# Patient Record
Sex: Female | Born: 1960 | Race: Black or African American | Hispanic: No | Smoking: Never smoker
Health system: Southern US, Community
[De-identification: ages and names within clinical notes are randomized; demographics above are authoritative.]

## PROBLEM LIST (undated history)

## (undated) DIAGNOSIS — M1990 Unspecified osteoarthritis, unspecified site: Secondary | ICD-10-CM

## (undated) DIAGNOSIS — D561 Beta thalassemia: Secondary | ICD-10-CM

## (undated) DIAGNOSIS — G709 Myoneural disorder, unspecified: Secondary | ICD-10-CM

## (undated) DIAGNOSIS — I10 Essential (primary) hypertension: Secondary | ICD-10-CM

## (undated) DIAGNOSIS — G4730 Sleep apnea, unspecified: Secondary | ICD-10-CM

## (undated) DIAGNOSIS — J45909 Unspecified asthma, uncomplicated: Secondary | ICD-10-CM

## (undated) DIAGNOSIS — Z5189 Encounter for other specified aftercare: Secondary | ICD-10-CM

## (undated) DIAGNOSIS — I219 Acute myocardial infarction, unspecified: Secondary | ICD-10-CM

## (undated) DIAGNOSIS — D689 Coagulation defect, unspecified: Secondary | ICD-10-CM

## (undated) DIAGNOSIS — T7840XA Allergy, unspecified, initial encounter: Secondary | ICD-10-CM

## (undated) DIAGNOSIS — I2510 Atherosclerotic heart disease of native coronary artery without angina pectoris: Secondary | ICD-10-CM

## (undated) DIAGNOSIS — I499 Cardiac arrhythmia, unspecified: Secondary | ICD-10-CM

## (undated) DIAGNOSIS — R0609 Other forms of dyspnea: Secondary | ICD-10-CM

## (undated) DIAGNOSIS — K559 Vascular disorder of intestine, unspecified: Secondary | ICD-10-CM

## (undated) DIAGNOSIS — K219 Gastro-esophageal reflux disease without esophagitis: Secondary | ICD-10-CM

## (undated) DIAGNOSIS — K589 Irritable bowel syndrome without diarrhea: Secondary | ICD-10-CM

## (undated) DIAGNOSIS — E739 Lactose intolerance, unspecified: Secondary | ICD-10-CM

## (undated) DIAGNOSIS — K469 Unspecified abdominal hernia without obstruction or gangrene: Secondary | ICD-10-CM

## (undated) DIAGNOSIS — J449 Chronic obstructive pulmonary disease, unspecified: Secondary | ICD-10-CM

## (undated) HISTORY — DX: Lactose intolerance, unspecified: E73.9

## (undated) HISTORY — DX: Unspecified osteoarthritis, unspecified site: M19.90

## (undated) HISTORY — DX: Beta thalassemia: D56.1

## (undated) HISTORY — DX: Sleep apnea, unspecified: G47.30

## (undated) HISTORY — DX: Other forms of dyspnea: R06.09

## (undated) HISTORY — DX: Unspecified abdominal hernia without obstruction or gangrene: K46.9

## (undated) HISTORY — DX: Unspecified asthma, uncomplicated: J45.909

## (undated) HISTORY — DX: Coagulation defect, unspecified: D68.9

## (undated) HISTORY — PX: ROTATOR CUFF REPAIR: SHX139

## (undated) HISTORY — DX: Cardiac arrhythmia, unspecified: I49.9

## (undated) HISTORY — DX: Allergy, unspecified, initial encounter: T78.40XA

## (undated) HISTORY — PX: UPPER GASTROINTESTINAL ENDOSCOPY: SHX188

## (undated) HISTORY — PX: TUBAL LIGATION: SHX77

## (undated) HISTORY — PX: TONSILLECTOMY: SUR1361

## (undated) HISTORY — PX: SHOULDER SURGERY: SHX246

## (undated) HISTORY — DX: Encounter for other specified aftercare: Z51.89

## (undated) HISTORY — DX: Irritable bowel syndrome, unspecified: K58.9

## (undated) HISTORY — PX: KNEE SURGERY: SHX244

## (undated) HISTORY — PX: ABDOMINAL HYSTERECTOMY: SHX81

## (undated) HISTORY — DX: Atherosclerotic heart disease of native coronary artery without angina pectoris: I25.10

## (undated) HISTORY — DX: Vascular disorder of intestine, unspecified: K55.9

## (undated) HISTORY — DX: Chronic obstructive pulmonary disease, unspecified: J44.9

## (undated) HISTORY — DX: Acute myocardial infarction, unspecified: I21.9

## (undated) HISTORY — PX: COLONOSCOPY: SHX174

## (undated) HISTORY — PX: APPENDECTOMY: SHX54

## (undated) HISTORY — DX: Myoneural disorder, unspecified: G70.9

## (undated) HISTORY — DX: Gastro-esophageal reflux disease without esophagitis: K21.9

## (undated) HISTORY — DX: Essential (primary) hypertension: I10

---

## 2012-02-02 DIAGNOSIS — K559 Vascular disorder of intestine, unspecified: Secondary | ICD-10-CM | POA: Insufficient documentation

## 2012-02-02 DIAGNOSIS — K219 Gastro-esophageal reflux disease without esophagitis: Secondary | ICD-10-CM | POA: Insufficient documentation

## 2012-02-02 DIAGNOSIS — K649 Unspecified hemorrhoids: Secondary | ICD-10-CM | POA: Insufficient documentation

## 2012-02-02 DIAGNOSIS — K5900 Constipation, unspecified: Secondary | ICD-10-CM | POA: Insufficient documentation

## 2012-07-21 DIAGNOSIS — G540 Brachial plexus disorders: Secondary | ICD-10-CM | POA: Insufficient documentation

## 2012-07-21 DIAGNOSIS — D561 Beta thalassemia: Secondary | ICD-10-CM | POA: Insufficient documentation

## 2013-04-09 DIAGNOSIS — M25512 Pain in left shoulder: Secondary | ICD-10-CM | POA: Insufficient documentation

## 2015-01-04 DIAGNOSIS — I10 Essential (primary) hypertension: Secondary | ICD-10-CM | POA: Insufficient documentation

## 2015-01-04 DIAGNOSIS — D509 Iron deficiency anemia, unspecified: Secondary | ICD-10-CM | POA: Insufficient documentation

## 2015-01-04 DIAGNOSIS — N390 Urinary tract infection, site not specified: Secondary | ICD-10-CM | POA: Insufficient documentation

## 2015-01-04 DIAGNOSIS — K625 Hemorrhage of anus and rectum: Secondary | ICD-10-CM | POA: Insufficient documentation

## 2015-01-04 DIAGNOSIS — R1030 Lower abdominal pain, unspecified: Secondary | ICD-10-CM | POA: Insufficient documentation

## 2018-10-31 DIAGNOSIS — Z90710 Acquired absence of both cervix and uterus: Secondary | ICD-10-CM | POA: Insufficient documentation

## 2019-02-05 ENCOUNTER — Encounter: Payer: Self-pay | Admitting: Family Medicine

## 2019-02-05 ENCOUNTER — Ambulatory Visit (INDEPENDENT_AMBULATORY_CARE_PROVIDER_SITE_OTHER): Payer: Federal, State, Local not specified - PPO | Admitting: Family Medicine

## 2019-02-05 DIAGNOSIS — K529 Noninfective gastroenteritis and colitis, unspecified: Secondary | ICD-10-CM | POA: Diagnosis not present

## 2019-02-05 DIAGNOSIS — R0982 Postnasal drip: Secondary | ICD-10-CM | POA: Diagnosis not present

## 2019-02-05 NOTE — Progress Notes (Signed)
Established Patient Office Visit  Subjective:  Patient ID: Tiffany Martin, female    DOB: 1961/01/13  Age: 58 y.o. MRN: [id number]  CC:  Chief Complaint  Patient presents with  . Establish Care  . Sore Throat    no fever, ongoing for a few days. has started using alka seltzer, runny nose.     HPI Tiffany Martin presents for 1 week ho sore throat, runny nose, pnd. Denies fever. Little cough with out shortness of breath. No change in taste or smell. No fever. Moved down here from [place] about a week ago as well. Exposure to Covid back in [place].  She has a history of allergic rhinitis and she has been taking her fexofenadine.  She had been followed by pain management in [place] and they gave her the phone number of his sister clinic here in the [place] area.  Ho Colitis and Celiac per gastroenterologist.  No meds. On gluten free diet.    History reviewed. No pertinent past medical history.  History reviewed. No pertinent surgical history.  History reviewed. No pertinent family history.  Social History   Socioeconomic History  . Marital status: Single    Spouse name: Not on file  . Number of children: Not on file  . Years of education: Not on file  . Highest education level: Not on file  Occupational History  . Not on file  Social Needs  . Financial resource strain: Not on file  . Food insecurity    Worry: Not on file    Inability: Not on file  . Transportation needs    Medical: Not on file    Non-medical: Not on file  Tobacco Use  . Smoking status: Never Smoker  . Smokeless tobacco: Never Used  Substance and Sexual Activity  . Alcohol use: Not on file  . Drug use: Not on file  . Sexual activity: Not on file  Lifestyle  . Physical activity    Days per week: Not on file    Minutes per session: Not on file  . Stress: Not on file  Relationships  . Social Herbalist on phone: Not on file    Gets together: Not on file    Attends religious service:  Not on file    Active member of club or organization: Not on file    Attends meetings of clubs or organizations: Not on file    Relationship status: Not on file  . Intimate partner violence    Fear of current or ex partner: Not on file    Emotionally abused: Not on file    Physically abused: Not on file    Forced sexual activity: Not on file  Other Topics Concern  . Not on file  Social History Narrative  . Not on file    Outpatient Medications Prior to Visit  Medication Sig Dispense Refill  . amlodipine-olmesartan (AZOR) 10-20 MG tablet Take 1 tablet by mouth daily.    . carvedilol (COREG) 25 MG tablet Take 25 mg by mouth 2 (two) times daily.    . cyclobenzaprine (FLEXERIL) 10 MG tablet Take 10 mg by mouth 3 (three) times daily.     No facility-administered medications prior to visit.     No Known Allergies  ROS Review of Systems  Constitutional: Negative for chills, diaphoresis, fatigue, fever and unexpected weight change.  HENT: Positive for congestion, postnasal drip, sneezing and sore throat.   Eyes: Negative for photophobia and visual disturbance.  Respiratory: Negative for cough, shortness of breath and wheezing.   Cardiovascular: Negative.   Gastrointestinal: Negative.  Negative for diarrhea, nausea and vomiting.  Genitourinary: Negative.   Musculoskeletal: Negative for gait problem.  Skin: Negative for pallor and rash.  Allergic/Immunologic: Negative for immunocompromised state.  Neurological: Negative for numbness and headaches.  Hematological: Does not bruise/bleed easily.  Psychiatric/Behavioral: Negative.       Objective:    Physical Exam  Constitutional: She is oriented to person, place, and time. She appears well-developed and well-nourished. No distress.  HENT:  Head: Normocephalic and atraumatic.  Right Ear: External ear normal.  Left Ear: External ear normal.  Eyes: Right eye exhibits no discharge. Left eye exhibits no discharge. No scleral  icterus.  Neck: No JVD present.  Pulmonary/Chest: Effort normal. No stridor.  Neurological: She is alert and oriented to person, place, and time.  Skin: Skin is warm and dry. She is not diaphoretic.  Psychiatric: She has a normal mood and affect.    There were no vitals taken for this visit. Wt Readings from Last 3 Encounters:  No data found for Wt   BP Readings from Last 3 Encounters:  No data found for BP   Guideline developer:  UpToDate (see UpToDate for funding source) Date Released: June 2014  Health Maintenance Due  Topic Date Due  . Hepatitis C Screening  07/02/58  . HIV Screening  02/18/1974  . TETANUS/TDAP  02/18/1978  . PAP SMEAR-Modifier  02/19/1980  . MAMMOGRAM  02/18/2009  . COLONOSCOPY  02/18/2009  . INFLUENZA VACCINE  11/04/2018    There are no preventive care reminders to display for this patient.  No results found for: TSH No results found for: WBC, HGB, HCT, MCV, PLT No results found for: NA, K, CHLORIDE, CO2, GLUCOSE, BUN, CREATININE, BILITOT, ALKPHOS, AST, ALT, PROT, ALBUMIN, CALCIUM, ANIONGAP, EGFR, GFR No results found for: CHOL No results found for: HDL No results found for: LDLCALC No results found for: TRIG No results found for: CHOLHDL No results found for: HGBA1C    Assessment & Plan:   Problem List Items Addressed This Visit      Digestive   Colitis   Relevant Orders   Ambulatory referral to Gastroenterology     Other   Post-nasal drip - Primary      No orders of the defined types were placed in this encounter.   Follow-up: Return in about 2 weeks (around 02/19/2019).   Patient will go to the [facility] for Covid testing.  She will follow-up in a couple weeks to establish properly here at the clinic.  Her symptoms are more consistent with her usual allergy rhinitis and she agrees.  Virtual Visit via Video Note  I connected with Tiffany Martin on 02/05/19 at  3:00 PM EST by a video enabled telemedicine application  and verified that I am speaking with the correct person using two identifiers.  Location: Patient: home Provider:    I discussed the limitations of evaluation and management by telemedicine and the availability of in person appointments. The patient expressed understanding and agreed to proceed.  History of Present Illness:    Observations/Objective:   Assessment and Plan:   Follow Up Instructions:    I discussed the assessment and treatment plan with the patient. The patient was provided an opportunity to ask questions and all were answered. The patient agreed with the plan and demonstrated an understanding of the instructions.   The patient was advised to call  back or seek an in-person evaluation if the symptoms worsen or if the condition fails to improve as anticipated.  I provided 20 minutes of non-face-to-face time during this encounter.   Libby Maw, MD

## 2019-02-06 ENCOUNTER — Other Ambulatory Visit: Payer: Self-pay

## 2019-02-06 ENCOUNTER — Encounter: Payer: Self-pay | Admitting: Physician Assistant

## 2019-02-06 DIAGNOSIS — Z20822 Contact with and (suspected) exposure to covid-19: Secondary | ICD-10-CM

## 2019-02-07 LAB — NOVEL CORONAVIRUS, NAA: SARS-CoV-2, NAA: NOT DETECTED

## 2019-02-16 ENCOUNTER — Ambulatory Visit: Payer: Federal, State, Local not specified - PPO | Admitting: Physician Assistant

## 2019-02-26 ENCOUNTER — Other Ambulatory Visit: Payer: Self-pay

## 2019-02-26 ENCOUNTER — Encounter: Payer: Self-pay | Admitting: Physician Assistant

## 2019-02-26 ENCOUNTER — Ambulatory Visit: Payer: Federal, State, Local not specified - PPO | Admitting: Physician Assistant

## 2019-02-26 ENCOUNTER — Telehealth: Payer: Self-pay

## 2019-02-26 VITALS — BP 114/64 | HR 74 | Temp 97.0°F | Ht 66.5 in | Wt 225.2 lb

## 2019-02-26 DIAGNOSIS — E739 Lactose intolerance, unspecified: Secondary | ICD-10-CM | POA: Diagnosis not present

## 2019-02-26 DIAGNOSIS — K900 Celiac disease: Secondary | ICD-10-CM

## 2019-02-26 DIAGNOSIS — K219 Gastro-esophageal reflux disease without esophagitis: Secondary | ICD-10-CM

## 2019-02-26 DIAGNOSIS — M810 Age-related osteoporosis without current pathological fracture: Secondary | ICD-10-CM

## 2019-02-26 DIAGNOSIS — Z8719 Personal history of other diseases of the digestive system: Secondary | ICD-10-CM

## 2019-02-26 DIAGNOSIS — K5900 Constipation, unspecified: Secondary | ICD-10-CM | POA: Diagnosis not present

## 2019-02-26 NOTE — Progress Notes (Signed)
Chief Complaint: Establishing care for history of reflux, celiac disease, ischemic colitis, constipation and lactose intolerance  HPI:    Tiffany Martin is a 58 year old African-American female with a past medical history as listed below, who was referred to me by Lynann Bologna establish care for history of reflux, celiac disease, constipation, ischemic colitis and lactose intolerance.      (At time of patient's visit we do not have any of her old records).  \    Today, Tiffany Martin explains that she recently moved from [place] about a month ago to come and help take care of her mother who is in her 1s and her brother who has recently been diagnosed with diabetes and some other chronic medical conditions.  Explains that she overall is doing well, but "it took some time to get here".  Explains that back in 2010 she was diagnosed with ischemic colitis after episodes of abdominal pain and rectal bleeding.  Explains that she had a few colonoscopies and eventually her diagnosis was ischemic colitis.  This was thought due to her Celebrex which she was taking and she was taken off of this.  She had a few more flares throughout that year, but has had no real trouble since the end of 2010.  Does describe one other colonoscopy she thinks about 5 years ago or so for screening, but tells me she does not have polyps.    Also describes a history of celiac disease diagnosed 1/2 to 2 years ago.  She went gluten-free about a year ago and since then has had no further "hard abdominal cramps" and diarrhea.  Explains that she saw nutritionist initially and has been trying very hard to avoid gluten.  Also tells me she is lactose intolerant, so has to avoid dairy.  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    Also describes a diagnosis of reflux for which she is on over-the-counter Omeprazole 20 mg 30 minutes before breakfast.  She has no breakthrough symptoms on this medicine.    Tells me that due to her diagnosis of  ischemic colitis patient was told she has to maintain regular bowel movements and for this is taking Linzess 290 mcg daily, this works well for her.    Denies fever, chills, weight loss, nausea, vomiting, change in bowel habits or abdominal pain.  Past Medical History:  Diagnosis Date  . Abdominal hernia   . GERD (gastroesophageal reflux disease)   . High blood pressure   . Irritable bowel syndrome   . Ischemic colitis ([place])   . Lactose intolerance     Past Surgical History:  Procedure Laterality Date  . APPENDECTOMY    . CESAREAN SECTION    . COLONOSCOPY    . KNEE SURGERY    . KNEE SURGERY    . ROTATOR CUFF REPAIR    . SHOULDER SURGERY    . TONSILLECTOMY    . UPPER GASTROINTESTINAL ENDOSCOPY      Current Outpatient Medications  Medication Sig Dispense Refill  . acetaminophen (TYLENOL) 500 MG tablet Take 1,000 mg by mouth every 8 (eight) hours as needed.    [name] amlodipine-olmesartan (AZOR) 10-20 MG tablet Take 1 tablet by mouth daily.    [name] aspirin 81 MG chewable tablet Chew 1 tablet by mouth daily.    . B Complex-Biotin-FA (B-100 COMPLEX PO) Take 1 capsule by mouth daily.    . carvedilol (COREG) 25 MG tablet Take 25 mg by mouth 2 (two) times daily.    [name]  Cholecalciferol (VITAMIN D3) 25 MCG (1000 UT) CAPS Take 1 capsule by mouth daily.    . cyclobenzaprine (FLEXERIL) 10 MG tablet Take 10 mg by mouth 3 (three) times daily.    [name] DIGESTIVE ENZYMES PO Take 1 capsule by mouth 2 (two) times daily with a meal.    . Doxylamine Succinate, Sleep, (SLEEP AID PO) Take 1 tablet by mouth at bedtime.    . DULoxetine (CYMBALTA) 60 MG capsule Take 60 mg by mouth daily.    . ferrous sulfate 325 (65 FE) MG tablet Take 1 tablet by mouth daily.    . fexofenadine (ALLEGRA) 180 MG tablet Take 1 tablet by mouth daily.    [name] gabapentin (NEURONTIN) 600 MG tablet Take 600 mg by mouth 3 (three) times daily.    [name] LINZESS 290 MCG CAPS capsule Take 290 mcg by mouth daily.    . ondansetron (ZOFRAN) 4 MG tablet  Take 1 tablet by mouth every 8 (eight) hours as needed.    . Probiotic Product (DIGESTIVE ADVANTAGE GUMMIES PO) Take 2 tablets by mouth daily.    . SUMAtriptan (IMITREX) 50 MG tablet Take 50 mg by mouth every 2 (two) hours as needed for migraine. May repeat in 2 hours if headache persists or recurs.    . traMADol (ULTRAM) 50 MG tablet Take 2 tablets by mouth every 8 (eight) hours as needed.    . vitamin C (ASCORBIC ACID) 500 MG tablet Take 500 mg by mouth daily.    . nitroGLYCERIN (NITROSTAT) 0.4 MG SL tablet Place under the tongue as needed.     No current facility-administered medications for this visit.     Allergies as of 02/26/2019  . (No Known Allergies)    History reviewed. No pertinent family history.  Social History   Socioeconomic History  . Marital status: Single    Spouse name: Not on file  . Number of children: Not on file  . Years of education: Not on file  . Highest education level: Not on file  Occupational History  . Not on file  Social Needs  . Financial resource strain: Not on file  . Food insecurity    Worry: Not on file    Inability: Not on file  . Transportation needs    Medical: Not on file    Non-medical: Not on file  Tobacco Use  . Smoking status: Never Smoker  . Smokeless tobacco: Never Used  Substance and Sexual Activity  . Alcohol use: Not on file  . Drug use: Not on file  . Sexual activity: Not on file  Lifestyle  . Physical activity    Days per week: Not on file    Minutes per session: Not on file  . Stress: Not on file  Relationships  . Social Herbalist on phone: Not on file    Gets together: Not on file    Attends religious service: Not on file    Active member of club or organization: Not on file    Attends meetings of clubs or organizations: Not on file    Relationship status: Not on file  . Intimate partner violence    Fear of current or ex partner: Not on file    Emotionally abused: Not on file    Physically  abused: Not on file    Forced sexual activity: Not on file  Other Topics Concern  . Not on file  Social History Narrative  . Not on file  Review of Systems:    Constitutional: No weight loss, fever or chills Skin: No rash  Cardiovascular: No chest pain  Respiratory: No SOB  Gastrointestinal: See HPI and otherwise negative Genitourinary: No dysuria  Neurological: No headache Musculoskeletal: No new muscle or joint pain Hematologic: No bleeding  Psychiatric: No history of depression or anxiety   Physical Exam:  Vital signs: BP 114/64   Pulse 74   Temp (!) 97 F (36.1 C)   Ht 5' 6.5" (1.689 m)   Wt 225 lb 3.2 oz (102.2 kg)   BMI 35.80 kg/m   Constitutional:   Pleasant overweight AA female appears to be in NAD, Well developed, Well nourished, alert and cooperative Head:  Normocephalic and atraumatic. Eyes:   PEERL, EOMI. No icterus. Conjunctiva pink. Ears:  Normal auditory acuity. Neck:  Supple Throat: Oral cavity and pharynx without inflammation, swelling or lesion.  Respiratory: Respirations even and unlabored. Lungs clear to auscultation bilaterally.   No wheezes, crackles, or rhonchi.  Cardiovascular: Normal S1, S2. No MRG. Regular rate and rhythm. No peripheral edema, cyanosis or pallor.  Gastrointestinal:  Soft, nondistended, nontender. No rebound or guarding. Normal bowel sounds. No appreciable masses or hepatomegaly. Rectal:  Not performed.  Msk:  Symmetrical without gross deformities. Without edema, no deformity or joint abnormality.  Neurologic:  Alert and  oriented x4;  grossly normal neurologically.  Skin:   Dry and intact without significant lesions or rashes. Psychiatric: Demonstrates good judgement and reason without abnormal affect or behaviors.  No recent labs or imaging.  Assessment: 1.  Constipation: Controlled with Linzess 290 mcg daily 2.  GERD: Controlled on Omeprazole 20 mg daily 3.  History of ischemic colitis: In 2010, at that time patient was  on Celebrex, sounds as though she has not had any problems since then 4.  Celiac disease: Diagnosed 1/2 to 2 years ago, currently on a gluten-free diet over the past year, has seen a nutritionist in the past, doing well 5.  Lactose intolerance  Plan: 1.  Discussed with patient she should continue doing what she is doing for her diet as it sounds though it is working well for her. 2.  Requested patient's records from her gastroenterologist in [place] today. 3.  Explained to patient that after receiving records will be able to put her in recall for her next colonoscopy. 4.  Continue Linzess 290 mcg daily.  Patient does not need refills today. 5.  Continue Omeprazole 20 mg 30-60 minutes fbefore breakfast.  Patient does not need refills today. 6.  Patient was assigned to Dr. Havery Moros this morning as he is supervising.  Explained that she can follow with [place] as needed.  Ellouise Newer, PA-C [facility] 02/26/2019, 9:28 AM  Cc: Libby Maw

## 2019-02-26 NOTE — Progress Notes (Signed)
Agree with assessment and plan with the following thoughts: - yes would be good to get records to clarify how they confirmed celiac disease - if she truly has celiac disease she should have a follow up TTG to ensure normal on gluten free diet. She should also have basic labs done if none done recently (CBC, CMET) as well as TSH. I would also make sure her osteoporosis screening is up to date and that she has had a recent DEXA scan. If she has not had a DEXA within a few years she should be referred for it. Thanks

## 2019-02-26 NOTE — Telephone Encounter (Signed)
-----   Message from Levin Erp, [place] sent at 02/26/2019  1:15 PM EST ----- Regarding: Can you call... Can you see Dr. Mickey Farber recs-call patient and see if she has had any of the things he suggested. Thx-JLL ----- Message ----- From: Yetta Flock, MD Sent: 02/26/2019  12:28 PM EST To: Levin Erp, PA    ----- Message ----- From: Levin Erp, [place] Sent: 02/26/2019   9:39 AM EST To: Yetta Flock, MD

## 2019-02-26 NOTE — Telephone Encounter (Signed)
Called patient and she states he last labs were May of this year and it has been several years since she had a Bone Density scan. Scheduled her for a DEXA scan on 03/07/19 at 9:00am and  She will get her labs the same day.

## 2019-02-26 NOTE — Patient Instructions (Signed)
If you are age 60 or older, your body mass index should be between 23-30. Your There is no height or weight on file to calculate BMI. If this is out of the aforementioned range listed, please consider follow up with your Primary Care Provider.  If you are age 40 or younger, your body mass index should be between 19-25. Your There is no height or weight on file to calculate BMI. If this is out of the aformentioned range listed, please consider follow up with your Primary Care Provider.     1. Continue Linzess and Omeprazole daily.

## 2019-03-05 ENCOUNTER — Other Ambulatory Visit: Payer: Self-pay

## 2019-03-05 ENCOUNTER — Ambulatory Visit (INDEPENDENT_AMBULATORY_CARE_PROVIDER_SITE_OTHER)
Admission: RE | Admit: 2019-03-05 | Discharge: 2019-03-05 | Disposition: A | Discharge status: Home / Self Care | Payer: Federal, State, Local not specified - PPO | Source: Ambulatory Visit | Attending: Gastroenterology | Admitting: Gastroenterology

## 2019-03-05 ENCOUNTER — Other Ambulatory Visit (INDEPENDENT_AMBULATORY_CARE_PROVIDER_SITE_OTHER): Payer: Federal, State, Local not specified - PPO

## 2019-03-05 DIAGNOSIS — K219 Gastro-esophageal reflux disease without esophagitis: Secondary | ICD-10-CM | POA: Diagnosis not present

## 2019-03-05 DIAGNOSIS — K900 Celiac disease: Secondary | ICD-10-CM

## 2019-03-05 DIAGNOSIS — M810 Age-related osteoporosis without current pathological fracture: Secondary | ICD-10-CM | POA: Diagnosis not present

## 2019-03-05 LAB — COMPREHENSIVE METABOLIC PANEL
ALT: 19 U/L (ref 0–35)
AST: 18 U/L (ref 0–37)
Albumin: 4 g/dL (ref 3.5–5.2)
Alkaline Phosphatase: 70 U/L (ref 39–117)
BUN: 12 mg/dL (ref 6–23)
CO2: 31 mEq/L (ref 19–32)
Calcium: 9.5 mg/dL (ref 8.4–10.5)
Chloride: 104 mEq/L (ref 96–112)
Creatinine, Ser: 0.83 mg/dL (ref 0.40–1.20)
GFR: 84.84 mL/min (ref >60.00)
Glucose, Bld: 98 mg/dL (ref 70–99)
Potassium: 4.2 mEq/L (ref 3.5–5.1)
Sodium: 143 mEq/L (ref 135–145)
Total Bilirubin: 0.5 mg/dL (ref 0.2–1.2)
Total Protein: 7.4 g/dL (ref 6.0–8.3)

## 2019-03-05 LAB — CBC WITH DIFFERENTIAL/PLATELET
Basophils Absolute: 0 10*3/uL (ref 0.0–0.1)
Basophils Relative: 0.7 % (ref 0.0–3.0)
Eosinophils Absolute: 0.3 10*3/uL (ref 0.0–0.7)
Eosinophils Relative: 4.7 % (ref 0.0–5.0)
HCT: 37.2 % (ref 36.0–46.0)
Hemoglobin: 11.7 g/dL — ABNORMAL LOW (ref 12.0–15.0)
Lymphocytes Relative: 34.1 % (ref 12.0–46.0)
Lymphs Abs: 2.1 10*3/uL (ref 0.7–4.0)
MCHC: 31.4 g/dL (ref 30.0–36.0)
MCV: 71.7 fl — ABNORMAL LOW (ref 78.0–100.0)
Monocytes Absolute: 0.3 10*3/uL (ref 0.1–1.0)
Monocytes Relative: 4.7 % (ref 3.0–12.0)
Neutro Abs: 3.4 10*3/uL (ref 1.4–7.7)
Neutrophils Relative %: 55.8 % (ref 43.0–77.0)
Platelets: 372 10*3/uL (ref 150.0–400.0)
RBC: 5.19 Mil/uL — ABNORMAL HIGH (ref 3.87–5.11)
RDW: 17.4 % — ABNORMAL HIGH (ref 11.5–15.5)
WBC: 6.1 10*3/uL (ref 4.0–10.5)

## 2019-03-05 LAB — TSH: TSH: 1.2 u[IU]/mL (ref 0.35–4.50)

## 2019-03-06 ENCOUNTER — Other Ambulatory Visit: Payer: Federal, State, Local not specified - PPO

## 2019-03-06 LAB — TISSUE TRANSGLUTAMINASE, IGA: (tTG) Ab, IgA: 3 U/mL

## 2019-03-07 ENCOUNTER — Inpatient Hospital Stay: Admission: RE | Admit: 2019-03-07 | Payer: Federal, State, Local not specified - PPO | Source: Ambulatory Visit

## 2019-03-07 ENCOUNTER — Other Ambulatory Visit: Payer: Self-pay

## 2019-03-07 DIAGNOSIS — D509 Iron deficiency anemia, unspecified: Secondary | ICD-10-CM

## 2019-03-14 ENCOUNTER — Other Ambulatory Visit: Payer: Self-pay

## 2019-03-15 ENCOUNTER — Ambulatory Visit: Payer: Federal, State, Local not specified - PPO | Admitting: Family Medicine

## 2019-03-15 ENCOUNTER — Encounter: Payer: Self-pay | Admitting: Family Medicine

## 2019-03-15 VITALS — BP 108/78 | HR 95 | Temp 97.0°F | Ht 66.5 in | Wt 226.2 lb

## 2019-03-15 DIAGNOSIS — I10 Essential (primary) hypertension: Secondary | ICD-10-CM

## 2019-03-15 DIAGNOSIS — G894 Chronic pain syndrome: Secondary | ICD-10-CM

## 2019-03-15 DIAGNOSIS — E559 Vitamin D deficiency, unspecified: Secondary | ICD-10-CM

## 2019-03-15 DIAGNOSIS — Z13820 Encounter for screening for osteoporosis: Secondary | ICD-10-CM | POA: Insufficient documentation

## 2019-03-15 DIAGNOSIS — M542 Cervicalgia: Secondary | ICD-10-CM | POA: Insufficient documentation

## 2019-03-15 DIAGNOSIS — Z8669 Personal history of other diseases of the nervous system and sense organs: Secondary | ICD-10-CM

## 2019-03-15 DIAGNOSIS — M5441 Lumbago with sciatica, right side: Secondary | ICD-10-CM

## 2019-03-15 DIAGNOSIS — E611 Iron deficiency: Secondary | ICD-10-CM | POA: Diagnosis not present

## 2019-03-15 DIAGNOSIS — Z114 Encounter for screening for human immunodeficiency virus [HIV]: Secondary | ICD-10-CM | POA: Insufficient documentation

## 2019-03-15 DIAGNOSIS — G8929 Other chronic pain: Secondary | ICD-10-CM | POA: Insufficient documentation

## 2019-03-15 DIAGNOSIS — J309 Allergic rhinitis, unspecified: Secondary | ICD-10-CM

## 2019-03-15 DIAGNOSIS — G542 Cervical root disorders, not elsewhere classified: Secondary | ICD-10-CM

## 2019-03-15 DIAGNOSIS — Z0000 Encounter for general adult medical examination without abnormal findings: Secondary | ICD-10-CM

## 2019-03-15 MED ORDER — CARVEDILOL 25 MG PO TABS: 25.0000 mg | ORAL_TABLET | Freq: Two times a day (BID) | ORAL | 2 refills | Status: DC

## 2019-03-15 MED ORDER — FEXOFENADINE HCL 180 MG PO TABS: 180.0000 mg | ORAL_TABLET | Freq: Every day | ORAL | 1 refills | Status: DC

## 2019-03-15 MED ORDER — TRAMADOL HCL 50 MG PO TABS: 100.0000 mg | ORAL_TABLET | Freq: Three times a day (TID) | ORAL | 1 refills | Status: DC | PRN

## 2019-03-15 MED ORDER — AMLODIPINE-OLMESARTAN 10-20 MG PO TABS: 1.0000 | ORAL_TABLET | Freq: Every day | ORAL | 2 refills | Status: DC

## 2019-03-15 NOTE — Progress Notes (Addendum)
Established Patient Office Visit  Subjective:  Patient ID: Tiffany Martin, female    DOB: [birth year]  Age: 58 y.o. MRN: [id number]  CC:  Chief Complaint  Patient presents with  . New Patient (Initial Visit)  . Medication Refill    amlodipine-olmesartan, carvediol, allegra     HPI Tiffany Martin presents for here for follow-up of the below listed issues.  History of hypertension and is been controlled with her Azor and carvedilol.  She has been told that she has peripheral artery disease.  She is not on a statin.  History of chronic pain issues around arthritis in multiple joints, cervical neuropathy and chronic lumbar neuropathy with intermittent sciatica to the right.  She had been taking Celebrex she tells me but developed an ischemic colitis and was told to never take an NSAID again.  She has been seeing chronic pain up in [place] who referred her to a sister clinic here in [place].  For this issue she has been taking Cymbalta, Neurontin and tramadol.  She had been seeing a neurosurgeon and has had injections.  She had told the neurosurgeon that she wanted to avoid surgery as long as she could.  History of allergic rhinitis and she takes chronic fexofenadine for this.  History of iron deficiency and vitamin D deficiency as indicated in the chart.  She has established with GI.  She will need to find a dentist.  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   Past Medical History:  Diagnosis Date  . Abdominal hernia   . Beta thalassemia ([place])   . GERD (gastroesophageal reflux disease)   . High blood pressure   . Irritable bowel syndrome   . Ischemic colitis ([place])   . Lactose intolerance     Past Surgical History:  Procedure Laterality Date  . ABDOMINAL HYSTERECTOMY    . APPENDECTOMY    . CESAREAN SECTION    . COLONOSCOPY    . KNEE SURGERY    . KNEE SURGERY    . ROTATOR CUFF REPAIR    . SHOULDER SURGERY    . TONSILLECTOMY    . UPPER GASTROINTESTINAL  ENDOSCOPY      No family history on file.  Social History   Socioeconomic History  . Marital status: Single    Spouse name: Not on file  . Number of children: Not on file  . Years of education: Not on file  . Highest education level: Not on file  Occupational History  . Not on file  Tobacco Use  . Smoking status: Never Smoker  . Smokeless tobacco: Never Used  Substance and Sexual Activity  . Alcohol use: Never  . Drug use: Not on file  . Sexual activity: Not on file  Other Topics Concern  . Not on file  Social History Narrative  . Not on file   Social Determinants of Health   Financial Resource Strain:   . Difficulty of Paying Living Expenses: Not on file  Food Insecurity:   . Worried About Charity fundraiser in the Last Year: Not on file  . Ran Out of Food in the Last Year: Not on file  Transportation Needs:   . Lack of Transportation (Medical): Not on file  . Lack of Transportation (Non-Medical): Not on file  Physical Activity:   . Days of Exercise per Week: Not on file  . Minutes of Exercise per Session: Not on file  Stress:   . Feeling  of Stress : Not on file  Social Connections:   . Frequency of Communication with Friends and Family: Not on file  . Frequency of Social Gatherings with Friends and Family: Not on file  . Attends Religious Services: Not on file  . Active Member of Clubs or Organizations: Not on file  . Attends Club or Organization Meetings: Not on file  . Marital Status: Not on file  Intimate Partner Violence:   . Fear of Current or Ex-Partner: Not on file  . Emotionally Abused: Not on file  . Physically Abused: Not on file  . Sexually Abused: Not on file    Outpatient Medications Prior to Visit  Medication Sig Dispense Refill  . acetaminophen (TYLENOL) 500 MG tablet Take 1,000 mg by mouth every 8 (eight) hours as needed.    . aspirin 81 MG chewable tablet Chew 1 tablet by mouth daily.    . B Complex-Biotin-FA (B-100 COMPLEX PO) Take 1  capsule by mouth daily.    . Cholecalciferol (VITAMIN D3) 25 MCG (1000 UT) CAPS Take 1 capsule by mouth daily.    . cyclobenzaprine (FLEXERIL) 10 MG tablet Take 10 mg by mouth 3 (three) times daily.    . DIGESTIVE ENZYMES PO Take 1 capsule by mouth 2 (two) times daily with a meal.    . DULoxetine (CYMBALTA) 60 MG capsule Take 60 mg by mouth daily.    . ferrous sulfate 325 (65 FE) MG tablet Take 1 tablet by mouth daily.    . fluticasone (FLONASE) 50 MCG/ACT nasal spray Place into the nose.    . gabapentin (NEURONTIN) 600 MG tablet Take 600 mg by mouth 3 (three) times daily.    . LINZESS 290 MCG CAPS capsule Take 290 mcg by mouth daily.    . nitroGLYCERIN (NITROSTAT) 0.4 MG SL tablet Place under the tongue as needed.    . ondansetron (ZOFRAN) 4 MG tablet Take 1 tablet by mouth every 8 (eight) hours as needed.    . Probiotic Product (DIGESTIVE ADVANTAGE GUMMIES PO) Take 2 tablets by mouth daily.    . SUMAtriptan (IMITREX) 50 MG tablet Take 50 mg by mouth every 2 (two) hours as needed for migraine. May repeat in 2 hours if headache persists or recurs.    . vitamin C (ASCORBIC ACID) 500 MG tablet Take 500 mg by mouth daily.    . amlodipine-olmesartan (AZOR) 10-20 MG tablet Take 1 tablet by mouth daily.    . carvedilol (COREG) 25 MG tablet Take 25 mg by mouth 2 (two) times daily.    . fexofenadine (ALLEGRA) 180 MG tablet Take 1 tablet by mouth daily.    . traMADol (ULTRAM) 50 MG tablet Take 2 tablets by mouth every 8 (eight) hours as needed.    . Doxylamine Succinate, Sleep, (SLEEP AID PO) Take 1 tablet by mouth at bedtime.     No facility-administered medications prior to visit.    Allergies  Allergen Reactions  . Pork-Derived Products Anaphylaxis  . Aspirin   . Codeine Nausea And Vomiting    documented per [facility] paper chart/ EMR   . Morphine Nausea And Vomiting  . Nsaids   . Latex Rash  . Penicillins Hives and Rash    ROS Review of Systems  HENT: Negative.   Eyes: Negative for  photophobia and visual disturbance.  Respiratory: Negative.   Cardiovascular: Negative.   Endocrine: Negative for polyphagia and polyuria.  Genitourinary: Negative.   Musculoskeletal: Positive for arthralgias, back pain, neck pain and   neck stiffness.  Skin: Negative for pallor and rash.  Allergic/Immunologic: Negative for immunocompromised state.  Neurological: Negative for light-headedness and headaches.  Hematological: Does not bruise/bleed easily.  Psychiatric/Behavioral: Negative.       Objective:    Physical Exam  Constitutional: She is oriented to person, place, and time. She appears well-developed and well-nourished. No distress.  HENT:  Head: Normocephalic and atraumatic.  Right Ear: External ear normal.  Left Ear: External ear normal.  Mouth/Throat: Oropharynx is clear and moist. No oropharyngeal exudate.  Eyes: Pupils are equal, round, and reactive to light. Conjunctivae and EOM are normal. Right eye exhibits no discharge. Left eye exhibits no discharge. No scleral icterus.  Neck: No JVD present. No tracheal deviation present. No thyromegaly present.  Cardiovascular: Normal rate, regular rhythm and normal heart sounds.  Pulses:      Dorsalis pedis pulses are 2+ on the right side and 2+ on the left side.       Posterior tibial pulses are 2+ on the right side and 2+ on the left side.  Pulmonary/Chest: Effort normal and breath sounds normal. No stridor.  Abdominal: Bowel sounds are normal.  Musculoskeletal:        General: No edema.     Cervical back: Neck supple.  Lymphadenopathy:    She has no cervical adenopathy.  Neurological: She is alert and oriented to person, place, and time.  Skin: Skin is warm and dry. She is not diaphoretic.  Psychiatric: She has a normal mood and affect. Her behavior is normal.    BP 108/78   Pulse 95   Temp (!) 97 F (36.1 C)   Ht 5' 6.5" (1.689 m)   Wt 226 lb 3.2 oz (102.6 kg)   SpO2 94%   BMI 35.96 kg/m  Wt Readings from Last 3  Encounters:  03/15/19 226 lb 3.2 oz (102.6 kg)  02/26/19 225 lb 3.2 oz (102.2 kg)     Health Maintenance Due  Topic Date Due  . Hepatitis C Screening  02/18/1959  . HIV Screening  02/18/1974  . TETANUS/TDAP  02/18/1978  . PAP SMEAR-Modifier  02/19/1980  . COLONOSCOPY  02/18/2009    There are no preventive care reminders to display for this patient.  Lab Results  Component Value Date   TSH 1.20 03/05/2019   Lab Results  Component Value Date   WBC 6.1 03/05/2019   HGB 11.7 (L) 03/05/2019   HCT 37.2 03/05/2019   MCV 71.7 (L) 03/05/2019   PLT 372.0 03/05/2019   Lab Results  Component Value Date   NA 143 03/05/2019   K 4.2 03/05/2019   CO2 31 03/05/2019   GLUCOSE 98 03/05/2019   BUN 12 03/05/2019   CREATININE 0.83 03/05/2019   BILITOT 0.5 03/05/2019   ALKPHOS 70 03/05/2019   AST 18 03/05/2019   ALT 19 03/05/2019   PROT 7.4 03/05/2019   ALBUMIN 4.0 03/05/2019   CALCIUM 9.5 03/05/2019   GFR 84.84 03/05/2019   No results found for: CHOL No results found for: HDL No results found for: LDLCALC No results found for: TRIG No results found for: CHOLHDL No results found for: HGBA1C    Assessment & Plan:   Problem List Items Addressed This Visit      Cardiovascular and Mediastinum   Essential hypertension - Primary   Relevant Medications   amlodipine-olmesartan (AZOR) 10-20 MG tablet   carvedilol (COREG) 25 MG tablet   Other Relevant Orders   CBC   Comp Met (CMET)     Urinalysis, Routine w reflex microscopic     Respiratory   Allergic rhinitis   Relevant Medications   fexofenadine (ALLEGRA) 180 MG tablet     Nervous and Auditory   Chronic right-sided low back pain with right-sided sciatica   Relevant Medications   traMADol (ULTRAM) 50 MG tablet   Other Relevant Orders   Ambulatory referral to Pain Clinic   Ambulatory referral to Sports Medicine   Cervical neuropathy   Relevant Orders   Ambulatory referral to Pain Clinic   Ambulatory referral to Sports  Medicine     Other   Vitamin D deficiency   Relevant Orders   VITAMIN D 25 Hydroxy (Vit-D Deficiency, Fractures)   History of migraine   Chronic pain syndrome   Relevant Medications   traMADol (ULTRAM) 50 MG tablet   Other Relevant Orders   Ambulatory referral to Pain Clinic   Healthcare maintenance   Relevant Orders   Direct LDL   Lipid Profile   Iron deficiency   Relevant Orders   CBC   Iron, TIBC and Ferritin Panel      Meds ordered this encounter  Medications  . amlodipine-olmesartan (AZOR) 10-20 MG tablet    Sig: Take 1 tablet by mouth daily.    Dispense:  90 tablet    Refill:  2  . carvedilol (COREG) 25 MG tablet    Sig: Take 1 tablet (25 mg total) by mouth 2 (two) times daily.    Dispense:  180 tablet    Refill:  2  . fexofenadine (ALLEGRA) 180 MG tablet    Sig: Take 1 tablet (180 mg total) by mouth daily.    Dispense:  90 tablet    Refill:  1  . traMADol (ULTRAM) 50 MG tablet    Sig: Take 2 tablets (100 mg total) by mouth every 8 (eight) hours as needed.    Dispense:  90 tablet    Refill:  1    Follow-up: No follow-ups on file.  Continued amlodipine olmesartan and carvedilol for her blood pressure.  Question history of peripheral artery disease with bounding pulses in the feet.  She will return fasting for above ordered blood work.  Referred to sports medicine for follow-up of her cervical and lumbar neuropathies.  Referral to pain management for management of her chronic pain syndromes.  William Alfred Kremer, MD   12/16 addendum: no dysuria or frequency. 

## 2019-03-19 ENCOUNTER — Other Ambulatory Visit: Payer: Self-pay

## 2019-03-20 ENCOUNTER — Other Ambulatory Visit (INDEPENDENT_AMBULATORY_CARE_PROVIDER_SITE_OTHER): Payer: Federal, State, Local not specified - PPO

## 2019-03-20 DIAGNOSIS — I10 Essential (primary) hypertension: Secondary | ICD-10-CM

## 2019-03-20 DIAGNOSIS — E559 Vitamin D deficiency, unspecified: Secondary | ICD-10-CM | POA: Diagnosis not present

## 2019-03-20 DIAGNOSIS — Z0000 Encounter for general adult medical examination without abnormal findings: Secondary | ICD-10-CM | POA: Diagnosis not present

## 2019-03-20 DIAGNOSIS — E611 Iron deficiency: Secondary | ICD-10-CM

## 2019-03-20 LAB — URINALYSIS, ROUTINE W REFLEX MICROSCOPIC
Bilirubin Urine: NEGATIVE
Hgb urine dipstick: NEGATIVE
Ketones, ur: NEGATIVE
Leukocytes,Ua: NEGATIVE
Nitrite: POSITIVE — AB
Specific Gravity, Urine: 1.02 (ref 1.000–1.030)
Urine Glucose: NEGATIVE
Urobilinogen, UA: 0.2 (ref 0.0–1.0)
pH: 6 (ref 5.0–8.0)

## 2019-03-20 LAB — COMPREHENSIVE METABOLIC PANEL
ALT: 15 U/L (ref 0–35)
AST: 15 U/L (ref 0–37)
Albumin: 4.2 g/dL (ref 3.5–5.2)
Alkaline Phosphatase: 71 U/L (ref 39–117)
BUN: 11 mg/dL (ref 6–23)
CO2: 31 mEq/L (ref 19–32)
Calcium: 9.5 mg/dL (ref 8.4–10.5)
Chloride: 104 mEq/L (ref 96–112)
Creatinine, Ser: 0.77 mg/dL (ref 0.40–1.20)
GFR: 92.5 mL/min (ref >60.00)
Glucose, Bld: 104 mg/dL — ABNORMAL HIGH (ref 70–99)
Potassium: 4.4 mEq/L (ref 3.5–5.1)
Sodium: 143 mEq/L (ref 135–145)
Total Bilirubin: 0.5 mg/dL (ref 0.2–1.2)
Total Protein: 7.2 g/dL (ref 6.0–8.3)

## 2019-03-20 LAB — LIPID PANEL
Cholesterol: 157 mg/dL (ref 0–200)
HDL: 47.7 mg/dL (ref >39.00)
LDL Cholesterol: 78 mg/dL (ref 0–99)
NonHDL: 109.6
Total CHOL/HDL Ratio: 3
Triglycerides: 160 mg/dL — ABNORMAL HIGH (ref 0.0–149.0)
VLDL: 32 mg/dL (ref 0.0–40.0)

## 2019-03-20 LAB — CBC
HCT: 37.6 % (ref 36.0–46.0)
Hemoglobin: 11.7 g/dL — ABNORMAL LOW (ref 12.0–15.0)
MCHC: 31.2 g/dL (ref 30.0–36.0)
MCV: 72 fl — ABNORMAL LOW (ref 78.0–100.0)
Platelets: 357 10*3/uL (ref 150.0–400.0)
RBC: 5.22 Mil/uL — ABNORMAL HIGH (ref 3.87–5.11)
RDW: 17.9 % — ABNORMAL HIGH (ref 11.5–15.5)
WBC: 6.8 10*3/uL (ref 4.0–10.5)

## 2019-03-20 LAB — LDL CHOLESTEROL, DIRECT: Direct LDL: 71 mg/dL

## 2019-03-20 LAB — VITAMIN D 25 HYDROXY (VIT D DEFICIENCY, FRACTURES): VITD: 30.12 ng/mL (ref 30.00–100.00)

## 2019-03-21 LAB — IRON,TIBC AND FERRITIN PANEL
%SAT: 21 % (calc) (ref 16–45)
Ferritin: 82 ng/mL (ref 16–232)
Iron: 64 ug/dL (ref 45–160)
TIBC: 308 mcg/dL (calc) (ref 250–450)

## 2019-03-22 ENCOUNTER — Ambulatory Visit: Payer: Federal, State, Local not specified - PPO | Admitting: Family Medicine

## 2019-03-22 NOTE — Progress Notes (Deleted)
Tiffany Martin - 58 y.o. female MRN [id number]  Date of birth: 1960/06/18  SUBJECTIVE:  Including CC & ROS.  No chief complaint on file.   Tiffany Martin is a 58 y.o. female that is  ***.  ***   Review of Systems  HISTORY: Past Medical, Surgical, Social, and Family History Reviewed & Updated per EMR.   Pertinent Historical Findings include:  Past Medical History:  Diagnosis Date  . Abdominal hernia   . Beta thalassemia ([place])   . GERD (gastroesophageal reflux disease)   . High blood pressure   . Irritable bowel syndrome   . Ischemic colitis ([place])   . Lactose intolerance     Past Surgical History:  Procedure Laterality Date  . ABDOMINAL HYSTERECTOMY    . APPENDECTOMY    . CESAREAN SECTION    . COLONOSCOPY    . KNEE SURGERY    . KNEE SURGERY    . ROTATOR CUFF REPAIR    . SHOULDER SURGERY    . TONSILLECTOMY    . UPPER GASTROINTESTINAL ENDOSCOPY      Allergies  Allergen Reactions  . Pork-Derived Products Anaphylaxis  . Aspirin   . Codeine Nausea And Vomiting    documented per [facility] paper chart/ EMR   . Morphine Nausea And Vomiting  . Nsaids   . Latex Rash  . Penicillins Hives and Rash    No family history on file.   Social History   Socioeconomic History  . Marital status: Single    Spouse name: Not on file  . Number of children: Not on file  . Years of education: Not on file  . Highest education level: Not on file  Occupational History  . Not on file  Tobacco Use  . Smoking status: Never Smoker  . Smokeless tobacco: Never Used  Substance and Sexual Activity  . Alcohol use: Never  . Drug use: Not on file  . Sexual activity: Not on file  Other Topics Concern  . Not on file  Social History Narrative  . Not on file   Social Determinants of Health   Financial Resource Strain:   . Difficulty of Paying Living Expenses: Not on file  Food Insecurity:   . Worried About Charity fundraiser in the Last Year: Not on file  . Ran Out of Food in the Last  Year: Not on file  Transportation Needs:   . Lack of Transportation (Medical): Not on file  . Lack of Transportation (Non-Medical): Not on file  Physical Activity:   . Days of Exercise per Week: Not on file  . Minutes of Exercise per Session: Not on file  Stress:   . Feeling of Stress : Not on file  Social Connections:   . Frequency of Communication with Friends and Family: Not on file  . Frequency of Social Gatherings with Friends and Family: Not on file  . Attends Religious Services: Not on file  . Active Member of Clubs or Organizations: Not on file  . Attends Archivist Meetings: Not on file  . Marital Status: Not on file  Intimate Partner Violence:   . Fear of Current or Ex-Partner: Not on file  . Emotionally Abused: Not on file  . Physically Abused: Not on file  . Sexually Abused: Not on file     PHYSICAL EXAM:  VS: There were no vitals taken for this visit. Physical Exam Gen: NAD, alert, cooperative with exam, well-appearing ENT: normal lips, normal nasal mucosa,  Eye:  normal EOM, normal conjunctiva and lids CV:  no edema, +2 pedal pulses   Resp: no accessory muscle use, non-labored,  GI: no masses or tenderness, no hernia  Skin: no rashes, no areas of induration  Neuro: normal tone, normal sensation to touch Psych:  normal insight, alert and oriented MSK:  ***      ASSESSMENT & PLAN:   No problem-specific Assessment & Plan notes found for this encounter.

## 2019-04-02 ENCOUNTER — Other Ambulatory Visit: Payer: Federal, State, Local not specified - PPO

## 2019-04-02 DIAGNOSIS — D509 Iron deficiency anemia, unspecified: Secondary | ICD-10-CM

## 2019-04-02 LAB — IRON,TIBC AND FERRITIN PANEL
%SAT: 26 % (calc) (ref 16–45)
Ferritin: 99 ng/mL (ref 16–232)
Iron: 83 ug/dL (ref 45–160)
TIBC: 320 mcg/dL (calc) (ref 250–450)

## 2019-04-04 ENCOUNTER — Encounter: Payer: Self-pay | Admitting: Family Medicine

## 2019-04-04 DIAGNOSIS — R8290 Unspecified abnormal findings in urine: Secondary | ICD-10-CM

## 2019-04-04 DIAGNOSIS — R350 Frequency of micturition: Secondary | ICD-10-CM

## 2019-04-04 MED ORDER — NITROFURANTOIN MONOHYD MACRO 100 MG PO CAPS: 100.0000 mg | ORAL_CAPSULE | Freq: Two times a day (BID) | ORAL | 0 refills | Status: AC

## 2019-04-12 ENCOUNTER — Other Ambulatory Visit: Payer: Self-pay

## 2019-04-12 ENCOUNTER — Ambulatory Visit: Payer: Federal, State, Local not specified - PPO | Admitting: Family Medicine

## 2019-04-12 ENCOUNTER — Encounter: Payer: Self-pay | Admitting: Family Medicine

## 2019-04-12 VITALS — BP 109/72 | HR 92 | Ht 66.0 in | Wt 221.0 lb

## 2019-04-12 DIAGNOSIS — G894 Chronic pain syndrome: Secondary | ICD-10-CM

## 2019-04-12 DIAGNOSIS — M5441 Lumbago with sciatica, right side: Secondary | ICD-10-CM

## 2019-04-12 DIAGNOSIS — M542 Cervicalgia: Secondary | ICD-10-CM

## 2019-04-12 DIAGNOSIS — G8929 Other chronic pain: Secondary | ICD-10-CM | POA: Diagnosis not present

## 2019-04-12 MED ORDER — BACLOFEN 10 MG PO TABS: 10.0000 mg | ORAL_TABLET | Freq: Two times a day (BID) | ORAL | 1 refills | Status: DC | PRN

## 2019-04-12 NOTE — Assessment & Plan Note (Signed)
Is establishing care with someone locally since she is recently moved from [place].  May have more of a fibromyalgia component.  Reports several areas of arthritis. -Uric acid, rheum panel -May benefit from a low-dose prednisone at night.

## 2019-04-12 NOTE — Progress Notes (Signed)
Tiffany Martin - 59 y.o. female MRN [id number]  Date of birth: 1960-08-12  SUBJECTIVE:  Including CC & ROS.  Chief Complaint  Patient presents with  . Neck Pain  . Back Pain    bilateral low back    Tiffany Martin is a 59 y.o. female that is presenting with low back pain with radicular symptoms bilaterally.  She is also reporting acute on chronic neck pain.  The pain has been occurring for several years now.  She is on Cymbalta, Flexeril and tramadol.  Has little improvement with medications.  She is also on gabapentin.  She has tried formal physical therapy some years ago.  Denies any specific inciting event.  The pain is intermittent in nature in the lower back.  It does have some radicular component.  Has had epidurals in the past.  Has recently moved from [place].  Has some altered sensation over the forefoot of her right foot.  Neck pain seems to be in the posterior aspect of the paraspinal muscles. Denies any numbness or tingling in her hands..  Review of the lumbar spine MRI from 2019 shows degenerative changes at L5-S1 with focal disc protrusion in the right lateral recess.  Shows superimposed asymmetric facet joint degeneration with right worse than left.  Review of the MRI cervical spine from 2019 shows degenerative changes at C4-5 with no spinal or foraminal stenosis or narrowing.   Review of Systems See HPI   HISTORY: Past Medical, Surgical, Social, and Family History Reviewed & Updated per EMR.   Pertinent Historical Findings include:  Past Medical History:  Diagnosis Date  . Abdominal hernia   . Beta thalassemia ([place])   . GERD (gastroesophageal reflux disease)   . High blood pressure   . Irritable bowel syndrome   . Ischemic colitis ([place])   . Lactose intolerance     Past Surgical History:  Procedure Laterality Date  . ABDOMINAL HYSTERECTOMY    . APPENDECTOMY    . CESAREAN SECTION    . COLONOSCOPY    . KNEE SURGERY    . KNEE SURGERY    . ROTATOR CUFF REPAIR      . SHOULDER SURGERY    . TONSILLECTOMY    . UPPER GASTROINTESTINAL ENDOSCOPY      Allergies  Allergen Reactions  . Pork-Derived Products Anaphylaxis  . Aspirin   . Codeine Nausea And Vomiting    documented per [facility] paper chart/ EMR   . Morphine Nausea And Vomiting  . Nsaids   . Latex Rash  . Penicillins Hives and Rash    No family history on file.   Social History   Socioeconomic History  . Marital status: Single    Spouse name: Not on file  . Number of children: Not on file  . Years of education: Not on file  . Highest education level: Not on file  Occupational History  . Not on file  Tobacco Use  . Smoking status: Never Smoker  . Smokeless tobacco: Never Used  Substance and Sexual Activity  . Alcohol use: Never  . Drug use: Not on file  . Sexual activity: Not on file  Other Topics Concern  . Not on file  Social History Narrative  . Not on file   Social Determinants of Health   Financial Resource Strain:   . Difficulty of Paying Living Expenses: Not on file  Food Insecurity:   . Worried About Charity fundraiser in the Last Year: Not on file  .  Ran Out of Food in the Last Year: Not on file  Transportation Needs:   . Lack of Transportation (Medical): Not on file  . Lack of Transportation (Non-Medical): Not on file  Physical Activity:   . Days of Exercise per Week: Not on file  . Minutes of Exercise per Session: Not on file  Stress:   . Feeling of Stress : Not on file  Social Connections:   . Frequency of Communication with Friends and Family: Not on file  . Frequency of Social Gatherings with Friends and Family: Not on file  . Attends Religious Services: Not on file  . Active Member of Clubs or Organizations: Not on file  . Attends Archivist Meetings: Not on file  . Marital Status: Not on file  Intimate Partner Violence:   . Fear of Current or Ex-Partner: Not on file  . Emotionally Abused: Not on file  . Physically Abused: Not on file   . Sexually Abused: Not on file     PHYSICAL EXAM:  VS: BP 109/72   Pulse 92   Ht 5\' 6"  (1.676 m)   Wt 221 lb (100.2 kg)   BMI 35.67 kg/m  Physical Exam Gen: NAD, alert, cooperative with exam, well-appearing ENT: normal lips, normal nasal mucosa,  Eye: normal EOM, normal conjunctiva and lids Skin: no rashes, no areas of induration  Neuro: normal tone, normal sensation to touch Psych:  normal insight, alert and oriented MSK:  Neck: Normal range of motion. Normal lateral rotation. Normal strength resistance with shrug. Normal shoulder range of motion. Normal grip strength. No signs of atrophy. Back: Normal flexion. Limited extension. Normal strength resistance with hip flexion, knee flexion extension, plantarflexion and dorsiflexion. Negative straight leg raise bilaterally. Neurovascularly intact     ASSESSMENT & PLAN:   Neck pain MRI from 2019 was not demonstrating any nerve impingement.  Symptoms may be related to fibromyalgia as opposed to being radicular in nature.  Currently on gabapentin, Flexeril, tramadol, and Cymbalta. -Stop the Flexeril and initiate baclofen. -Could consider amitriptyline or nortriptyline and stop the Cymbalta.  Has been on Cymbalta for about a year and unclear if it helps.  May have some interaction with the tramadol however.  Chronic pain syndrome Is establishing care with someone locally since she is recently moved from [place].  May have more of a fibromyalgia component.  Reports several areas of arthritis. -Uric acid, rheum panel -May benefit from a low-dose prednisone at night.  Chronic right-sided low back pain with right-sided sciatica Different changes in the MRI from 2019 show facet disease as well as nerve impingement.  It does not explain the sensation she feels down the left leg however. -Epidural. -Physical therapy. -May consider referral to neurosurgery if no improvement

## 2019-04-12 NOTE — Assessment & Plan Note (Signed)
Different changes in the MRI from 2019 show facet disease as well as nerve impingement.  It does not explain the sensation she feels down the left leg however. -Epidural. -Physical therapy. -May consider referral to neurosurgery if no improvement

## 2019-04-12 NOTE — Patient Instructions (Signed)
Nice to meet you I will call you with results for today. Physical therapy will call you for an appointment on [facility]. Please stop the Flexeril and start the baclofen.  It may make you drowsy to begin with. You may need to call [facility] imaging to set up the epidural for low back. Please send me a message in MyChart with any questions or updates.  Please see me back in 4 weeks.   --Dr. Raeford Razor

## 2019-04-12 NOTE — Assessment & Plan Note (Signed)
MRI from 2019 was not demonstrating any nerve impingement.  Symptoms may be related to fibromyalgia as opposed to being radicular in nature.  Currently on gabapentin, Flexeril, tramadol, and Cymbalta. -Stop the Flexeril and initiate baclofen. -Could consider amitriptyline or nortriptyline and stop the Cymbalta.  Has been on Cymbalta for about a year and unclear if it helps.  May have some interaction with the tramadol however.

## 2019-04-14 LAB — URIC ACID: Uric Acid: 6.5 mg/dL (ref 3.0–7.2)

## 2019-04-14 LAB — ANA,IFA RA DIAG PNL W/RFLX TIT/PATN
ANA Titer 1: NEGATIVE
Cyclic Citrullin Peptide Ab: 23 units — ABNORMAL HIGH (ref 0–19)
Rheumatoid fact SerPl-aCnc: <10 IU/mL (ref 0.0–13.9)

## 2019-04-17 ENCOUNTER — Telehealth: Payer: Self-pay | Admitting: Family Medicine

## 2019-04-17 ENCOUNTER — Encounter: Payer: Self-pay | Admitting: Family Medicine

## 2019-04-17 DIAGNOSIS — R768 Other specified abnormal immunological findings in serum: Secondary | ICD-10-CM

## 2019-04-17 DIAGNOSIS — M542 Cervicalgia: Secondary | ICD-10-CM

## 2019-04-17 DIAGNOSIS — G894 Chronic pain syndrome: Secondary | ICD-10-CM

## 2019-04-17 DIAGNOSIS — G8929 Other chronic pain: Secondary | ICD-10-CM

## 2019-04-17 NOTE — Telephone Encounter (Signed)
Informed of results. Will refer to rheum.   Rosemarie Ax, MD [facility] 04/17/2019, 10:54 AM

## 2019-04-24 ENCOUNTER — Other Ambulatory Visit: Payer: Self-pay | Admitting: Family Medicine

## 2019-04-24 ENCOUNTER — Inpatient Hospital Stay: Admission: RE | Admit: 2019-04-24 | Payer: Federal, State, Local not specified - PPO | Source: Ambulatory Visit

## 2019-04-24 DIAGNOSIS — G8929 Other chronic pain: Secondary | ICD-10-CM

## 2019-04-26 DIAGNOSIS — M2550 Pain in unspecified joint: Secondary | ICD-10-CM | POA: Diagnosis not present

## 2019-04-26 DIAGNOSIS — R768 Other specified abnormal immunological findings in serum: Secondary | ICD-10-CM | POA: Diagnosis not present

## 2019-04-27 ENCOUNTER — Encounter: Payer: Self-pay | Admitting: Family Medicine

## 2019-05-09 ENCOUNTER — Telehealth (INDEPENDENT_AMBULATORY_CARE_PROVIDER_SITE_OTHER): Payer: Federal, State, Local not specified - PPO | Admitting: Family Medicine

## 2019-05-09 DIAGNOSIS — G8929 Other chronic pain: Secondary | ICD-10-CM

## 2019-05-09 DIAGNOSIS — M542 Cervicalgia: Secondary | ICD-10-CM | POA: Diagnosis not present

## 2019-05-09 DIAGNOSIS — M5441 Lumbago with sciatica, right side: Secondary | ICD-10-CM

## 2019-05-09 DIAGNOSIS — G894 Chronic pain syndrome: Secondary | ICD-10-CM

## 2019-05-09 DIAGNOSIS — R768 Other specified abnormal immunological findings in serum: Secondary | ICD-10-CM

## 2019-05-09 MED ORDER — RAYOS 5 MG PO TBEC: 5.0000 mg | DELAYED_RELEASE_TABLET | Freq: Every day | ORAL | 1 refills | Status: DC

## 2019-05-09 MED ORDER — RAYOS 5 MG PO TBEC: 5.0000 mg | DELAYED_RELEASE_TABLET | Freq: Every day | ORAL | 3 refills | Status: DC

## 2019-05-09 MED ORDER — METHOCARBAMOL 500 MG PO TABS: 500.0000 mg | ORAL_TABLET | Freq: Two times a day (BID) | ORAL | 1 refills | Status: DC | PRN

## 2019-05-09 NOTE — Assessment & Plan Note (Signed)
MRI was showing some impingement on the L5-S1.  She obtained this record and will take to [facility] imaging in order to schedule the epidural. -Continue plan of epidural -Counseled on supportive care

## 2019-05-09 NOTE — Assessment & Plan Note (Signed)
Pain is ongoing and has PT scheduled.  - stop baclofen due to rash.  Initiate Robaxin

## 2019-05-09 NOTE — Assessment & Plan Note (Signed)
Has seen rheumatology.  They did not feel that this was a full-blown case of rheumatoid.  May be early in the disease. -Counseled on supportive care. -Rayos.

## 2019-05-09 NOTE — Progress Notes (Signed)
Virtual Visit via Video Note  I connected with Tiffany Martin on 05/09/19 at 10:30 AM EST by a video enabled telemedicine application and verified that I am speaking with the correct person using two identifiers.   I discussed the limitations of evaluation and management by telemedicine and the availability of in person appointments. The patient expressed understanding and agreed to proceed.  History of Present Illness:  Ms. Tiffany Martin is a 59 year old female is following up for her low back pain and neck pain.  She has physical therapy scheduled.  She has not scheduled her epidural yet as she had to get the records from her previous MRI.  The pain is ongoing.  It seems to be different than her normal pain that she experiences.  She developed a rash with the baclofen so that was stopped.  She had been taking the Flexeril for some time and did not seem to have much improvement.  She was seen by rheumatology but did not start any formal medication.  They reported that her symptoms were caught early.   Observations/Objective:  Gen: NAD, alert, cooperative with exam, well-appearing  Assessment and Plan:  Neck pain:  Pain is ongoing and has PT scheduled.  - stop baclofen due to rash.  Initiate Robaxin  Chronic right-sided low back pain with right-sided sciatica: MRI was showing some impingement on the L5-S1.  She obtained this record and will take to [facility] imaging in order to schedule the epidural. -Continue plan of epidural -Counseled on supportive care  Positive anti-CCP test:  Has seen rheumatology.  They did not feel that this was a full-blown case of rheumatoid.  May be early in the disease. -Counseled on supportive care. -Rayos.    Follow Up Instructions:    I discussed the assessment and treatment plan with the patient. The patient was provided an opportunity to ask questions and all were answered. The patient agreed with the plan and demonstrated an understanding of the  instructions.   The patient was advised to call back or seek an in-person evaluation if the symptoms worsen or if the condition fails to improve as anticipated.   Clearance Coots, MD

## 2019-05-10 ENCOUNTER — Ambulatory Visit: Payer: Federal, State, Local not specified - PPO | Admitting: Family Medicine

## 2019-05-15 ENCOUNTER — Ambulatory Visit: Payer: Federal, State, Local not specified - PPO | Admitting: Physical Therapy

## 2019-05-15 ENCOUNTER — Ambulatory Visit
Admission: RE | Admit: 2019-05-15 | Discharge: 2019-05-15 | Disposition: A | Discharge status: Home / Self Care | Payer: Federal, State, Local not specified - PPO | Source: Ambulatory Visit | Attending: Family Medicine | Admitting: Family Medicine

## 2019-05-15 DIAGNOSIS — G8929 Other chronic pain: Secondary | ICD-10-CM | POA: Diagnosis not present

## 2019-05-15 DIAGNOSIS — M545 Low back pain: Secondary | ICD-10-CM | POA: Diagnosis not present

## 2019-05-15 DIAGNOSIS — M4727 Other spondylosis with radiculopathy, lumbosacral region: Secondary | ICD-10-CM | POA: Diagnosis not present

## 2019-05-15 DIAGNOSIS — M5441 Lumbago with sciatica, right side: Secondary | ICD-10-CM

## 2019-05-15 MED ORDER — METHYLPREDNISOLONE ACETATE 40 MG/ML INJ SUSP (RADIOLOG
120.0000 mg | Freq: Once | INTRAMUSCULAR | Status: AC
  Administered 2019-05-15: 120 mg via EPIDURAL

## 2019-05-15 MED ORDER — DIAZEPAM 5 MG PO TABS
5.0000 mg | ORAL_TABLET | Freq: Once | ORAL | Status: AC
  Administered 2019-05-15: 5 mg via ORAL

## 2019-05-15 MED ORDER — IOPAMIDOL (ISOVUE-M 200) INJECTION 41%
1.0000 mL | Freq: Once | INTRAMUSCULAR | Status: AC
  Administered 2019-05-15: 1 mL via EPIDURAL

## 2019-05-15 NOTE — Discharge Instructions (Signed)

## 2019-05-24 ENCOUNTER — Ambulatory Visit: Payer: Federal, State, Local not specified - PPO | Admitting: Physical Therapy

## 2019-05-29 ENCOUNTER — Telehealth: Payer: Self-pay | Admitting: Physical Therapy

## 2019-05-29 NOTE — Telephone Encounter (Signed)
Left note for pt with address and time of appt on Thursday 05-31-2019 at 12:30 Pm with [address].  Voncille Lo, PT Certified Exercise Expert for the Aging Adult  05/29/19 9:42 AM Phone: [phone] Fax: [phone]

## 2019-05-31 ENCOUNTER — Ambulatory Visit: Payer: Federal, State, Local not specified - PPO | Attending: Family Medicine | Admitting: Physical Therapy

## 2019-05-31 ENCOUNTER — Other Ambulatory Visit: Payer: Self-pay

## 2019-05-31 ENCOUNTER — Encounter: Payer: Self-pay | Admitting: Physical Therapy

## 2019-05-31 DIAGNOSIS — R293 Abnormal posture: Secondary | ICD-10-CM | POA: Insufficient documentation

## 2019-05-31 DIAGNOSIS — M542 Cervicalgia: Secondary | ICD-10-CM

## 2019-05-31 DIAGNOSIS — G8929 Other chronic pain: Secondary | ICD-10-CM | POA: Diagnosis not present

## 2019-05-31 DIAGNOSIS — M6281 Muscle weakness (generalized): Secondary | ICD-10-CM | POA: Diagnosis not present

## 2019-05-31 DIAGNOSIS — M5441 Lumbago with sciatica, right side: Secondary | ICD-10-CM | POA: Insufficient documentation

## 2019-05-31 NOTE — Therapy (Addendum)
[facility] [place], [place], [address] Phone: [phone]   Fax:  [phone]  Physical Therapy Evaluation/Discharge Note  Patient Details  Name: Tiffany Martin MRN: [id number] Date of Birth: 1960/08/30 Referring Provider (PT): Clearance Coots MD   Encounter Date: 05/31/2019  PT End of Session - 05/31/19 1423    Visit Number  1    Number of Visits  13    Date for PT Re-Evaluation  07/12/19    Authorization Type  [employer]    PT Start Time  1230    PT Stop Time  1320    PT Time Calculation (min)  50 min    Activity Tolerance  Patient tolerated treatment well    Behavior During Therapy  [facility] for tasks assessed/performed       Past Medical History:  Diagnosis Date  . Abdominal hernia   . Beta thalassemia ([place])   . GERD (gastroesophageal reflux disease)   . High blood pressure   . Irritable bowel syndrome   . Ischemic colitis ([place])   . Lactose intolerance     Past Surgical History:  Procedure Laterality Date  . ABDOMINAL HYSTERECTOMY    . APPENDECTOMY    . CESAREAN SECTION    . COLONOSCOPY    . KNEE SURGERY    . KNEE SURGERY    . ROTATOR CUFF REPAIR    . SHOULDER SURGERY    . TONSILLECTOMY    . UPPER GASTROINTESTINAL ENDOSCOPY      There were no vitals filed for this visit.   Subjective Assessment - 05/31/19 1238    Subjective  I moved from [place] in October 2020 and I was having injection.  I have gained a lot of weight and I would like to be able to get back to exercises.  I  was just diagnosed with RA    Pertinent History  colitis, Celiacs, Chronic back pain 4-5 years, RA , HTN, anemia. See Medical chart    How long can you sit comfortably?  1 hour    How long can you stand comfortably?  20 minutes    How long can you walk comfortably?  I am trying to  walk 10-15 min    Diagnostic tests  MRI    Patient Stated Goals  I want to get back into exericise    Currently in Pain?  Yes    Pain Score  3     on meds  at worst 8/10   Pain Location  Back    Pain Orientation  Right;Left;Mid    Pain Descriptors / Indicators  Radiating;Sharp    Pain Type  Chronic pain    Pain Radiating Towards  radiates into my RT foot with a tight band around it all the time    Pain Onset  More than a month ago    Pain Frequency  Intermittent    Aggravating Factors   bending over, trying to lift,  sweeping is the worst, I need to move around    Pain Relieving Factors  resting, meds and elevate legs    Multiple Pain Sites  Yes    Pain Score  3   at worst 8/10   Pain Location  Neck    Pain Orientation  Left;Right    Pain Descriptors / Indicators  Throbbing    Pain Type  Chronic pain    Pain Radiating Towards  radiating to both upper traps    Pain Onset  More  than a month ago    Pain Frequency  Intermittent    Aggravating Factors   looking up causes, driving and turning neck going to [facility] PT Assessment - 05/31/19 0001      Assessment   Medical Diagnosis  chronic low back pain with RT sciatica and cervicalgia    Referring Provider (PT)  Clearance Coots MD    Onset Date/Surgical Date  --   about 2 years   Hand Dominance  Right    Prior Therapy  none      Precautions   Precautions  None      Restrictions   Weight Bearing Restrictions  No      Balance Screen   Has the patient fallen in the past 6 months  No    Has the patient had a decrease in activity level because of a fear of falling?   No    Is the patient reluctant to leave their home because of a fear of falling?   No      Home Film/video editor residence    Living Arrangements  Spouse/significant other;Other relatives    Type of Home  House      Prior Function   Level of Independence  Independent      Cognition   Overall Cognitive Status  Within Functional Limits for tasks assessed      Observation/Other Assessments   Focus on Therapeutic Outcomes (FOTO)   FOTO intake for lumbar spine 30%,  liimitation 70%, predicted 49%, neck intake 39% limtiation 61% predicted 44%      Functional Tests   Functional tests  Squat;Lunges      Squat   Comments  can only squat 60degrees hip/knees      Lunges   Comments  difficulty without UE support and 1/2 range, unable to bring knee parallel      Sit to Stand   Comments  5 x sts 25.62  Normal 13 sec or less      Posture/Postural Control   Posture/Postural Control  Postural limitations    Postural Limitations  Anterior pelvic tilt;Rounded Shoulders;Forward head    Posture Comments  RT leg length shorter than RT.  Elevated LT pelvic level  given heel lift      AROM   Overall AROM   Deficits    Cervical Flexion  54    Cervical Extension  41   pain and pressure   Cervical - Right Side Bend  18   pain   Cervical - Left Side Bend  23    Cervical - Right Rotation  45   ERP   Cervical - Left Rotation  60    Lumbar Flexion  50 %   touch just below knees   Lumbar Extension  60%    Lumbar - Right Side Bend  50%   pain on RT   Lumbar - Left Side Bend  50%    pain RT side still   Lumbar - Right Rotation  80%    Lumbar - Left Rotation  80%      Strength   Overall Strength  Deficits    Overall Strength Comments  neck flexion supine 11 sec ( normal 35 sec)    Right Shoulder Flexion  4+/5    Right Shoulder ABduction  4+/5    Left Shoulder Flexion  4+/5    Left Shoulder ABduction  4+/5  Right Hip Flexion  4+/5    Right Hip Extension  4-/5    Right Hip ABduction  4-/5    Left Hip Flexion  4+/5    Left Hip Extension  4/5    Left Hip ABduction  4-/5    Right Knee Flexion  4/5    Right Knee Extension  4/5    Left Knee Flexion  4/5    Left Knee Extension  4/5      Palpation   Palpation comment  tenderness over bil cervical paraspinals and spasm in bil upper trap mx,  Pt with TTP on RT QL and RT gluteals,                Objective measurements completed on examination: See above findings.              PT  Education - 05/31/19 1312    Education Details  POC Explanation of findings  intial HEP    Person(s) Educated  Patient    Methods  Explanation;Demonstration    Comprehension  Verbalized understanding;Returned demonstration;Verbal cues required;Tactile cues required       PT Short Term Goals - 05/31/19 1424      PT SHORT TERM GOAL #1   Title  STG=LTG        PT Long Term Goals - 05/31/19 1313      PT LONG TERM GOAL #1   Title  Pt will be independent  with advanced HEP    Period  Weeks    Status  New    Target Date  07/12/19      PT LONG TERM GOAL #2   Title  Return to walking for health for 3 x a week 2 miles    Time  6    Period  Weeks    Status  New    Target Date  07/12/19      PT LONG TERM GOAL #3   Title  Be able turn head to drive without exacerbating pain    Time  6    Period  Weeks    Status  New    Target Date  07/12/19      PT LONG TERM GOAL #4   Title  Pt will be able to perform household chores with at least 50% decreased pain    Baseline  With movement, pt has 8/10 pain in neck and back    Time  6    Period  Weeks    Status  New    Target Date  07/12/19      PT LONG TERM GOAL #5   Title  FOTO will improve from 70%    to 49% limitation for back and from 61 % to 44% limitaiton for neck    indicating improved functional mobility.    Time  6    Period  Weeks    Target Date  07/12/19             Plan - 05/31/19 1416    Clinical Impression Statement  59 yo female with Lumbar spondylosis and cervicalgia( myofasical pain syndrome) recently diagnosed per pt report with RA present with chronic RT low back with RT sciatica/ cervicalgia bil.  She has difficulty bending forward, turning head to drive, doing household chores and would like to return to more active lifestyle and return to a walking program  She states she used to walk 5 miles with girl friends and would like to return to  more physical activity.  Pt would benefit from skilled PT to address  impairments in AROM, muscle weakness, abnormal posture,and pain.    Personal Factors and Comorbidities  Comorbidity 3+    Comorbidities  colitis, Celiacs, Obesity,Chronic back pain 4-5 years, RA , HTN, anemia. See Medical chart    Examination-Activity Limitations  Bend;Lift;Sit;Stand;Squat;Transfers    Examination-Participation Restrictions  Cleaning;Laundry;Meal Prep    Stability/Clinical Decision Making  Evolving/Moderate complexity    Clinical Decision Making  Moderate    Rehab Potential  Good    PT Frequency  2x / week    PT Duration  6 weeks    PT Treatment/Interventions  ADLs/Self Care Home Management;Cryotherapy;Electrical Stimulation;Iontophoresis 10m/ml Dexamethasone;Moist Heat;Traction;Ultrasound;Therapeutic exercise;Therapeutic activities;Functional mobility training;Neuromuscular re-education;Patient/family education;Passive range of motion;Manual techniques;Dry needling;Taping;Joint Manipulations;Spinal Manipulations;Gait training;Stair training    PT Next Visit Plan  try to give heel lift to pt on RT side for LLD, reinforce HEP and progress, ADL/posture back /neck    PT Home Exercise Plan  [id number]    Consulted and Agree with Plan of Care  Patient       Patient will benefit from skilled therapeutic intervention in order to improve the following deficits and impairments:  Obesity, Pain, Decreased activity tolerance, Decreased mobility, Improper body mechanics, Postural dysfunction, Impaired flexibility, Increased muscle spasms, Increased fascial restricitons, Hypomobility, Decreased strength, Decreased range of motion  Visit Diagnosis: Chronic right-sided low back pain with right-sided sciatica - Plan: PT plan of care cert/re-cert  Cervicalgia - Plan: PT plan of care cert/re-cert  Muscle weakness (generalized) - Plan: PT plan of care cert/re-cert  Abnormal posture     Problem List Patient Active Problem List   Diagnosis Date Noted  . Positive anti-CCP test 05/09/2019   . Allergic rhinitis 03/15/2019  . Vitamin D deficiency 03/15/2019  . History of migraine 03/15/2019  . Chronic pain syndrome 03/15/2019  . Chronic right-sided low back pain with right-sided sciatica 03/15/2019  . Healthcare maintenance 03/15/2019  . Iron deficiency 03/15/2019  . Essential hypertension 03/15/2019  . Neck pain 03/15/2019  . Post-nasal drip 02/05/2019  . Colitis 02/05/2019   LVoncille Lo PT Certified Exercise Expert for the Aging Adult  05/31/19 3:06 PM Phone: [phone]: 3[facility] [place] [address]Phone: [phone]  Fax:  [phone] Name: AMisa FedorkoMRN: [id number] of Birth: 111/24/1962  PHYSICAL THERAPY DISCHARGE SUMMARY  Visits from Start of Care: 1  Current functional level related to goals / functional outcomes: unknown   Remaining deficits: unknown   Education / Equipment: Initial HEP Plan:                                                    Patient goals were not met. Patient is being discharged due to not returning since the last visit.  ?????    Never returned to clinic for subsequent visits LVoncille Lo PT Certified Exercise Expert for the Aging Adult  11/06/19 3:57 PM Phone: [phone]: [phone]

## 2019-05-31 NOTE — Patient Instructions (Signed)
           Voncille Lo, PT Certified Exercise Expert for the Aging Adult  05/31/19 1:12 PM Phone: [phone] Fax: [phone]

## 2019-05-31 NOTE — Addendum Note (Signed)
Addended by: Dorothea Ogle on: 05/31/2019 03:09 PM   Modules accepted: Orders

## 2019-06-15 ENCOUNTER — Ambulatory Visit: Payer: Federal, State, Local not specified - PPO | Admitting: Physical Therapy

## 2019-06-19 ENCOUNTER — Ambulatory Visit: Payer: Federal, State, Local not specified - PPO | Admitting: Physical Therapy

## 2019-06-19 ENCOUNTER — Other Ambulatory Visit: Payer: Self-pay

## 2019-06-19 ENCOUNTER — Encounter: Payer: Self-pay | Admitting: Family Medicine

## 2019-06-19 DIAGNOSIS — G894 Chronic pain syndrome: Secondary | ICD-10-CM

## 2019-06-19 DIAGNOSIS — G8929 Other chronic pain: Secondary | ICD-10-CM

## 2019-06-19 MED ORDER — TRAMADOL HCL 50 MG PO TABS: 100.0000 mg | ORAL_TABLET | Freq: Three times a day (TID) | ORAL | 1 refills | Status: DC | PRN

## 2019-06-19 MED ORDER — DULOXETINE HCL 60 MG PO CPEP: 60.0000 mg | ORAL_CAPSULE | Freq: Every day | ORAL | 1 refills | Status: DC

## 2019-06-19 MED ORDER — GABAPENTIN 600 MG PO TABS: 600.0000 mg | ORAL_TABLET | Freq: Three times a day (TID) | ORAL | 1 refills | Status: DC

## 2019-06-19 NOTE — Telephone Encounter (Signed)
WK-Pt was supposed to call and schedule/thx dmf

## 2019-06-19 NOTE — Telephone Encounter (Signed)
Spoke with patient regarding pain management she states that she have tried calling several times with no return call. She did see dr. Tamala Julian who gave her a injection in her back and she has an appointment with him tomorrow. Patient states that Dr. Tamala Julian wanted to hold on injections in her neck. Patient states that she will ask to see if he knew of any other pain management locations she could call.

## 2019-06-19 NOTE — Telephone Encounter (Signed)
Patient called states that she is going to [place] regarding the sell of her house and will need some refills on pending medications while there. Last visit December 2020.

## 2019-06-19 NOTE — Telephone Encounter (Signed)
Okay 

## 2019-06-20 ENCOUNTER — Ambulatory Visit: Payer: Federal, State, Local not specified - PPO | Admitting: Family Medicine

## 2019-06-20 ENCOUNTER — Encounter: Payer: Self-pay | Admitting: Family Medicine

## 2019-06-20 ENCOUNTER — Other Ambulatory Visit: Payer: Self-pay

## 2019-06-20 DIAGNOSIS — R768 Other specified abnormal immunological findings in serum: Secondary | ICD-10-CM | POA: Diagnosis not present

## 2019-06-20 DIAGNOSIS — M179 Osteoarthritis of knee, unspecified: Secondary | ICD-10-CM | POA: Insufficient documentation

## 2019-06-20 DIAGNOSIS — E611 Iron deficiency: Secondary | ICD-10-CM

## 2019-06-20 DIAGNOSIS — G894 Chronic pain syndrome: Secondary | ICD-10-CM

## 2019-06-20 DIAGNOSIS — R42 Dizziness and giddiness: Secondary | ICD-10-CM

## 2019-06-20 DIAGNOSIS — M5441 Lumbago with sciatica, right side: Secondary | ICD-10-CM

## 2019-06-20 DIAGNOSIS — G8929 Other chronic pain: Secondary | ICD-10-CM

## 2019-06-20 DIAGNOSIS — M542 Cervicalgia: Secondary | ICD-10-CM | POA: Diagnosis not present

## 2019-06-20 DIAGNOSIS — M170 Bilateral primary osteoarthritis of knee: Secondary | ICD-10-CM

## 2019-06-20 DIAGNOSIS — M1710 Unilateral primary osteoarthritis, unspecified knee: Secondary | ICD-10-CM | POA: Insufficient documentation

## 2019-06-20 NOTE — Progress Notes (Signed)
Tiffany Martin - 59 y.o. female MRN [id number]  Date of birth: 1960-07-24  SUBJECTIVE:  Including CC & ROS.  Chief Complaint  Patient presents with  . Follow-up    follow up for neck / low back    Tiffany Martin is a 59 y.o. female that is following up for her low back pain with sciatica as well as her neck pain and then presenting with lightheadedness and bilateral knee pain.  She reports that her low back pain with sciatica has improved significantly since she has received the epidural injection.  The neck pain is still ongoing.  She is currently enrolled in physical therapy.  She has a history of 5 surgeries on the right knee and 2 surgeries in the left knee.  She has received injections previously.  She reports pain that has been occurring as of late.  She usually gets improvement with the surgeries.  Denies any specific inciting event or trauma.  She also has felt lightheadedness.  Her blood pressure has been on the lower side as well.  Gabapentin - Radicular symptoms  Cymbalta - Fibromyalgia type symptoms  Tramadol - Chronic low back, neck and knee pain  Rayos - mildly positive Anti-CCP  Baclofen - Discontinued (rash)  Robaxin - low back pain  Flexeril - discontinued (taking for long period of time and try something new)     Review of Systems See HPI   HISTORY: Past Medical, Surgical, Social, and Family History Reviewed & Updated per EMR.   Pertinent Historical Findings include:  Past Medical History:  Diagnosis Date  . Abdominal hernia   . Beta thalassemia ([place])   . GERD (gastroesophageal reflux disease)   . High blood pressure   . Irritable bowel syndrome   . Ischemic colitis ([place])   . Lactose intolerance     Past Surgical History:  Procedure Laterality Date  . ABDOMINAL HYSTERECTOMY    . APPENDECTOMY    . CESAREAN SECTION    . COLONOSCOPY    . KNEE SURGERY    . KNEE SURGERY    . ROTATOR CUFF REPAIR    . SHOULDER SURGERY    . TONSILLECTOMY    . UPPER  GASTROINTESTINAL ENDOSCOPY      No family history on file.  Social History   Socioeconomic History  . Marital status: Single    Spouse name: Not on file  . Number of children: Not on file  . Years of education: Not on file  . Highest education level: Not on file  Occupational History  . Not on file  Tobacco Use  . Smoking status: Never Smoker  . Smokeless tobacco: Never Used  Substance and Sexual Activity  . Alcohol use: Never  . Drug use: Not on file  . Sexual activity: Not on file  Other Topics Concern  . Not on file  Social History Narrative  . Not on file   Social Determinants of Health   Financial Resource Strain:   . Difficulty of Paying Living Expenses:   Food Insecurity:   . Worried About Charity fundraiser in the Last Year:   . Arboriculturist in the Last Year:   Transportation Needs:   . Film/video editor (Medical):   [name] Lack of Transportation (Non-Medical):   Physical Activity:   . Days of Exercise per Week:   . Minutes of Exercise per Session:   Stress:   . Feeling of Stress :   Social Connections:   .  Frequency of Communication with Friends and Family:   . Frequency of Social Gatherings with Friends and Family:   . Attends Religious Services:   . Active Member of Clubs or Organizations:   . Attends Archivist Meetings:   [name] Marital Status:   Intimate Partner Violence:   . Fear of Current or Ex-Partner:   . Emotionally Abused:   [name] Physically Abused:   . Sexually Abused:      PHYSICAL EXAM:  VS: BP 94/62   Ht 5\' 6"  (1.676 m)   Wt 227 lb (103 kg)   BMI 36.64 kg/m  Physical Exam Gen: NAD, alert, cooperative with exam, well-appearing MSK:  Neck:  Normal range of motion. Normal strength resistance with shrug. Normal shoulder range of motion. Some tenderness palpation of the paraspinal cervical muscles. Back: Normal range of motion. Normal strength resistance. 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 -Currently on Rayos.  We will slowly wean off of this and see if her symptoms change.  -Could initiate naproxen in its place  Neck pain Pain is ongoing. Mild to moderate.  - counseled on home exercise therapy and supportive care. -Has physical therapy ordered. -Could consider imaging  Chronic pain syndrome  Gabapentin - Radicular symptoms  Cymbalta - Fibromyalgia type symptoms  Tramadol - Chronic low back, neck and knee pain  Rayos - mildly positive Anti-CCP  Baclofen - Discontinued (rash)  Robaxin - low back pain  Flexeril - discontinued (taking for long period of time and try something new)   Chronic right-sided low back pain with right-sided sciatica Has been doing well since the epidural injection.  Pain has been controlled and it works right away. -Counseled on home exercise therapy and supportive care. -Can continue gabapentin. -Can continue physical therapy. -Could consider repeat epidural if needed.  Lightheadedness This is acutely been occurring and her blood pressure was lower than normal today.  She has a history of anemia but she reports having history of beta thalassemia as the origin.  Is on several different medications which could be also playing a component.  Review of her most recent blood work shows normal ferritin on 12/28.  She also had a normal TSH on 11/30. -Counseled to discuss her blood pressure regimen with primary care. -Counseled on adjusting her medications.  We will try to wean off of the Rayos to see if this is affecting her blood pressure.  Could consider adding an naproxen in its place.  Would also try titrating down the tramadol if she tolerates.  OA (osteoarthritis) of knee Reports to having 5 surgeries on the right knee and 2 surgeries on the left knee.  She received injections and physical therapy in the past as well.  The pain has been slowly resurfacing. -Counseled on supportive care. -We can try an OA reaction  brace for the right. -Could consider injection and imaging or physical therapy.  Iron deficiency Counseled on taking oral iron.

## 2019-06-20 NOTE — Patient Instructions (Signed)
Good to see you Please try weaning off of the prednisone. We'll see if it's helping or not. We could try adding naproxen  Please talk to Dr. Ethelene Hal about the blood pressure medications.  You've had the blood already so we don't need to check it.  Please send me a message in MyChart with any questions or updates.  Please see me back in 6-8 weeks.   --Dr. Raeford Razor

## 2019-06-20 NOTE — Assessment & Plan Note (Signed)
Counseled on taking oral iron.

## 2019-06-20 NOTE — Assessment & Plan Note (Signed)
This is acutely been occurring and her blood pressure was lower than normal today.  She has a history of anemia but she reports having history of beta thalassemia as the origin.  Is on several different medications which could be also playing a component.  Review of her most recent blood work shows normal ferritin on 12/28.  She also had a normal TSH on 11/30. -Counseled to discuss her blood pressure regimen with primary care. -Counseled on adjusting her medications.  We will try to wean off of the Rayos to see if this is affecting her blood pressure.  Could consider adding an naproxen in its place.  Would also try titrating down the tramadol if she tolerates.

## 2019-06-20 NOTE — Assessment & Plan Note (Signed)
Mildly positive. -Currently on Rayos.  We will slowly wean off of this and see if her symptoms change.  -Could initiate naproxen in its place

## 2019-06-20 NOTE — Assessment & Plan Note (Signed)
Reports to having 5 surgeries on the right knee and 2 surgeries on the left knee.  She received injections and physical therapy in the past as well.  The pain has been slowly resurfacing. -Counseled on supportive care. -We can try an OA reaction brace for the right. -Could consider injection and imaging or physical therapy.

## 2019-06-20 NOTE — Assessment & Plan Note (Signed)
Pain is ongoing. Mild to moderate.  - counseled on home exercise therapy and supportive care. -Has physical therapy ordered. -Could consider imaging

## 2019-06-20 NOTE — Assessment & Plan Note (Signed)
Has been doing well since the epidural injection.  Pain has been controlled and it works right away. -Counseled on home exercise therapy and supportive care. -Can continue gabapentin. -Can continue physical therapy. -Could consider repeat epidural if needed.

## 2019-06-20 NOTE — Assessment & Plan Note (Signed)
  Gabapentin - Radicular symptoms  Cymbalta - Fibromyalgia type symptoms  Tramadol - Chronic low back, neck and knee pain  Rayos - mildly positive Anti-CCP  Baclofen - Discontinued (rash)  Robaxin - low back pain  Flexeril - discontinued (taking for long period of time and try something new)

## 2019-07-20 ENCOUNTER — Encounter: Payer: Self-pay | Admitting: Gastroenterology

## 2019-07-20 ENCOUNTER — Ambulatory Visit: Payer: Federal, State, Local not specified - PPO | Admitting: Gastroenterology

## 2019-07-20 VITALS — BP 110/60 | HR 72 | Temp 97.2°F | Ht 66.5 in | Wt 220.6 lb

## 2019-07-20 DIAGNOSIS — K625 Hemorrhage of anus and rectum: Secondary | ICD-10-CM

## 2019-07-20 DIAGNOSIS — K5909 Other constipation: Secondary | ICD-10-CM

## 2019-07-20 DIAGNOSIS — K869 Disease of pancreas, unspecified: Secondary | ICD-10-CM | POA: Diagnosis not present

## 2019-07-20 DIAGNOSIS — R933 Abnormal findings on diagnostic imaging of other parts of digestive tract: Secondary | ICD-10-CM

## 2019-07-20 DIAGNOSIS — K8689 Other specified diseases of pancreas: Secondary | ICD-10-CM

## 2019-07-20 MED ORDER — NA SULFATE-K SULFATE-MG SULF 17.5-3.13-1.6 GM/177ML PO SOLN: 1.0000 | Freq: Once | ORAL | 0 refills | Status: AC

## 2019-07-20 NOTE — Progress Notes (Signed)
Agree with assessment and plan as outlined.  

## 2019-07-20 NOTE — Patient Instructions (Addendum)
If you are age 61 or older, your body mass index should be between 23-30. Your Body mass index is 35.07 kg/m. If this is out of the aforementioned range listed, please consider follow up with your Primary Care Provider.  If you are age 81 or younger, your body mass index should be between 19-25. Your Body mass index is 35.07 kg/m. If this is out of the aformentioned range listed, please consider follow up with your Primary Care Provider.   Janett Billow, PA recommends that you complete a bowel purge (to clean out your bowels). Please do the following: 2 bottles of magnesium citrate tonight.   You have been scheduled for an MRI at [facility] (1st floor radiology) on Wednesday 08/01/19. Your appointment time is 7 am. Please arrive 15 minutes prior to your appointment time for registration purposes. Please make certain not to have anything to eat or drink 6 hours prior to your test. In addition, if you have any metal in your body, have a pacemaker or defibrillator, please be sure to let your ordering physician know. This test typically takes 45 minutes to 1 hour to complete. Should you need to reschedule, please call [phone] to do so.

## 2019-07-20 NOTE — Progress Notes (Signed)
07/20/2019 [name] [id number] 1960/10/03   HISTORY OF PRESENT ILLNESS: This is a 59 year old female who recently established care here back in November 2020.  She saw Ellouise Newer, one of our PAs, and care was established with Dr. Havery Moros.  She apparently has a history of acid reflux, celiac disease, chronic constipation, lactose intolerance, and history of episodes of ischemic colitis.  She relocated here from [place].  Apparently had ischemic colitis back in 2010 that was thought to be due to Celebrex.  Obviously she has been taken off of this.  Apparently she had had no further issues with that since 2010.  It appears that her last colonoscopy was in December 2013.  Looks like she had nonthrombosed external hemorrhoids and the rest of exam was normal.   Recently she made a trip back to [place] back in March.  She says that prior to her leaving she started with some rectal bleeding.  Then while she was there she started having diarrhea and lower abdominal pain.  She went to the emergency department and a CT scan of the abdomen pelvis with contrast was performed and showed the following:   1. Mild wall thickening of the sigmoid colon may represent colitis. 2. Nonspecific dilation of the pancreatic duct measuring up to 4 mm. Further characterization with MRI is recommended. 3. Hypoattenuating nodule within the pancreatic head is stable in size when compared to the examination from 2016. This may represent a prominent fat lobulation, however can also be further evaluated on MRI.  They apparently gave her still for nausea, and Norco for abdominal pain, but did not admit her to the hospital.  No antibiotics.  She says that since his most recent issue she has been having more trouble with constipation.  Usually her Linzess 290 mcg works well.  She has been taking that and has also added MiraLAX BID, but has been having a lot of straining.  Continues to have lower abdominal  cramping and left-sided pain that has been more constant recently.    Past Medical History:  Diagnosis Date  . Abdominal hernia   . Beta thalassemia ([place])   . GERD (gastroesophageal reflux disease)   . High blood pressure   . Irritable bowel syndrome   . Ischemic colitis ([place])   . Lactose intolerance    Past Surgical History:  Procedure Laterality Date  . ABDOMINAL HYSTERECTOMY    . APPENDECTOMY    . CESAREAN SECTION    . COLONOSCOPY    . KNEE SURGERY Right    x 3  . KNEE SURGERY Left    x 2  . ROTATOR CUFF REPAIR Left   . SHOULDER SURGERY Bilateral    for thoracic outlet syndrome  . TONSILLECTOMY    . UPPER GASTROINTESTINAL ENDOSCOPY      reports that she has never smoked. She has never used smokeless tobacco. She reports that she does not drink alcohol. No history on file for drug. family history includes Cirrhosis in her father; Liver disease in her sister. Allergies  Allergen Reactions  . Pork-Derived Products Anaphylaxis  . Codeine Nausea And Vomiting    documented per [facility] paper chart/ EMR   . Morphine Nausea And Vomiting  . Penicillins Hives and Rash  . Aspirin   . Baclofen   . Nsaids   . Latex Rash      Outpatient Encounter Medications as of 07/20/2019  Medication Sig  . acetaminophen (TYLENOL) 500 MG tablet Take 1,000 mg by  mouth every 8 (eight) hours as needed.  [name] amlodipine-olmesartan (AZOR) 10-20 MG tablet Take 1 tablet by mouth daily.  [name] aspirin 81 MG chewable tablet Chew 1 tablet by mouth daily.  . B Complex-Biotin-FA (B-100 COMPLEX PO) Take 1 capsule by mouth daily.  . carvedilol (COREG) 25 MG tablet Take 1 tablet (25 mg total) by mouth 2 (two) times daily.  . Cholecalciferol (VITAMIN D3) 25 MCG (1000 UT) CAPS Take 1 capsule by mouth daily.  . cyclobenzaprine (FLEXERIL) 10 MG tablet Take 10 mg by mouth 3 (three) times daily.  [name] DIGESTIVE ENZYMES PO Take 1 capsule by mouth 2 (two) times daily with a meal.  . DULoxetine (CYMBALTA) 60 MG capsule  Take 1 capsule (60 mg total) by mouth daily.  . ferrous sulfate 325 (65 FE) MG tablet Take 1 tablet by mouth daily.  . fexofenadine (ALLEGRA) 180 MG tablet Take 1 tablet (180 mg total) by mouth daily.  . fluticasone (FLONASE) 50 MCG/ACT nasal spray Place into the nose.  . gabapentin (NEURONTIN) 600 MG tablet Take 1 tablet (600 mg total) by mouth 3 (three) times daily.  [name] LINZESS 290 MCG CAPS capsule Take 290 mcg by mouth daily.  . methocarbamol (ROBAXIN) 500 MG tablet Take 1 tablet (500 mg total) by mouth 2 (two) times daily as needed for muscle spasms.  . nitroGLYCERIN (NITROSTAT) 0.4 MG SL tablet Place under the tongue as needed.  . ondansetron (ZOFRAN) 4 MG tablet Take 1 tablet by mouth every 8 (eight) hours as needed.  . Probiotic Product (DIGESTIVE ADVANTAGE GUMMIES PO) Take 2 tablets by mouth daily.  . SUMAtriptan (IMITREX) 50 MG tablet Take 50 mg by mouth every 2 (two) hours as needed for migraine. May repeat in 2 hours if headache persists or recurs.  . traMADol (ULTRAM) 50 MG tablet Take 2 tablets (100 mg total) by mouth every 8 (eight) hours as needed.  . vitamin C (ASCORBIC ACID) 500 MG tablet Take 500 mg by mouth daily.  . [DISCONTINUED] baclofen (LIORESAL) 10 MG tablet Take 1 tablet (10 mg total) by mouth 2 (two) times daily as needed for muscle spasms. (Patient not taking: Reported on 05/31/2019)  . [DISCONTINUED] predniSONE (RAYOS) 5 MG TBEC Take 5 mg by mouth at bedtime.   No facility-administered encounter medications on file as of 07/20/2019.     REVIEW OF SYSTEMS  : All other systems reviewed and negative except where noted in the History of Present Illness.   PHYSICAL EXAM: BP 110/60   Pulse 72   Temp (!) 97.2 F (36.2 C)   Ht 5' 6.5" (1.689 m)   Wt 220 lb 9.6 oz (100.1 kg)   BMI 35.07 kg/m  General: Well developed AA female in no acute distress Head: Normocephalic and atraumatic Eyes:  Sclerae anicteric, conjunctiva pink. Ears: Normal auditory acuity Lungs:  Clear throughout to auscultation; no increased WOB. Heart: Regular rate and rhythm; no M/R/G. Abdomen: Soft, non-distended.  BS present.  Mild LLQ TTP. Rectal:  Will be done at the time of colonoscopy. Musculoskeletal: Symmetrical with no gross deformities  Skin: No lesions on visible extremities Extremities: No edema  Neurological: Alert oriented x 4, grossly non-focal Psychological:  Alert and cooperative. Normal mood and affect  ASSESSMENT AND PLAN: *Rectal bleeding with recent abnormal CT scan showing sigmoid colitis.  Has history of episodes of ischemic colitis in the past that were thought to be due to Celebrex.  Obviously she is no longer on that medication.  Last colonoscopy  appears to be 2013.  We will plan for colonoscopy with Dr. Havery Moros.  The risks, benefits, and alternatives to colonoscopy were discussed with the patient and she consents to proceed.  Will give 2 day bowel prep. *Chronic constipation typically does well with her Linzess 290 mcg daily, but this has not been working well recently even with the addition of MiraLAX.  I have asked her to obtain 2 bottles of magnesium citrate.  Advised to drink 1 of those this evening and if nothing happens within an hour then to drink a second bottle.  Hopefully this will help to get things moving and then she can resume her daily Linzess dosing tomorrow. *Pancreatic duct dilation and pancreatic lesion: Seen on recent contrast CT scan.  Recommend an MRI for further evaluation.  We will schedule this.   CC:  Tiffany Martin,*

## 2019-07-23 ENCOUNTER — Ambulatory Visit (INDEPENDENT_AMBULATORY_CARE_PROVIDER_SITE_OTHER): Payer: Federal, State, Local not specified - PPO

## 2019-07-23 ENCOUNTER — Other Ambulatory Visit: Payer: Self-pay | Admitting: Gastroenterology

## 2019-07-23 ENCOUNTER — Encounter: Payer: Self-pay | Admitting: Gastroenterology

## 2019-07-23 DIAGNOSIS — Z1159 Encounter for screening for other viral diseases: Secondary | ICD-10-CM

## 2019-07-23 LAB — SARS CORONAVIRUS 2 (TAT 6-24 HRS): SARS Coronavirus 2: NEGATIVE

## 2019-07-25 ENCOUNTER — Encounter: Payer: Self-pay | Admitting: Gastroenterology

## 2019-07-25 ENCOUNTER — Ambulatory Visit (AMBULATORY_SURGERY_CENTER): Payer: Federal, State, Local not specified - PPO | Admitting: Gastroenterology

## 2019-07-25 ENCOUNTER — Other Ambulatory Visit: Payer: Self-pay

## 2019-07-25 VITALS — BP 106/52 | HR 83 | Temp 97.5°F | Resp 16 | Ht 66.0 in | Wt 220.0 lb

## 2019-07-25 DIAGNOSIS — K625 Hemorrhage of anus and rectum: Secondary | ICD-10-CM | POA: Diagnosis not present

## 2019-07-25 DIAGNOSIS — D123 Benign neoplasm of transverse colon: Secondary | ICD-10-CM | POA: Diagnosis not present

## 2019-07-25 DIAGNOSIS — K5730 Diverticulosis of large intestine without perforation or abscess without bleeding: Secondary | ICD-10-CM

## 2019-07-25 DIAGNOSIS — R933 Abnormal findings on diagnostic imaging of other parts of digestive tract: Secondary | ICD-10-CM

## 2019-07-25 DIAGNOSIS — D122 Benign neoplasm of ascending colon: Secondary | ICD-10-CM

## 2019-07-25 DIAGNOSIS — K648 Other hemorrhoids: Secondary | ICD-10-CM

## 2019-07-25 MED ORDER — SODIUM CHLORIDE 0.9 % IV SOLN: 500.0000 mL | Freq: Once | INTRAVENOUS | Status: DC

## 2019-07-25 NOTE — Op Note (Signed)
[place] Patient Name: Tiffany Martin Procedure Date: 07/25/2019 10:39 AM MRN: [id number] Endoscopist: Remo Lipps P. Havery Moros , MD Age: 59 Referring MD:  Date of Birth: 1960-09-14 Gender: Female Account #: [id number] Procedure:                Colonoscopy Indications:              Hematochezia, altered bowel habits, Abnormal CT of                            the GI tract showing sigmoid thickening - remote                            history of ischemic colitis Medicines:                Monitored Anesthesia Care Procedure:                Pre-Anesthesia Assessment:                           - Prior to the procedure, a History and Physical                            was performed, and patient medications and                            allergies were reviewed. The patient's tolerance of                            previous anesthesia was also reviewed. The risks                            and benefits of the procedure and the sedation                            options and risks were discussed with the patient.                            All questions were answered, and informed consent                            was obtained. Prior Anticoagulants: The patient has                            taken no previous anticoagulant or antiplatelet                            agents. ASA Grade Assessment: II - A patient with                            mild systemic disease. After reviewing the risks                            and benefits, the patient was deemed in  satisfactory condition to undergo the procedure.                           After obtaining informed consent, the colonoscope                            was passed under direct vision. Throughout the                            procedure, the patient's blood pressure, pulse, and                            oxygen saturations were monitored continuously. The                            Colonoscope was  introduced through the anus and                            advanced to the the terminal ileum, with                            identification of the appendiceal orifice and IC                            valve. The colonoscopy was technically difficult                            and complex due to restricted mobility of the colon                            and significant looping. The patient tolerated the                            procedure well. The quality of the bowel                            preparation was good. The terminal ileum, ileocecal                            valve, appendiceal orifice, and rectum were                            photographed. Scope In: 10:44:14 AM Scope Out: 11:13:24 AM Scope Withdrawal Time: 0 hours 19 minutes 24 seconds  Total Procedure Duration: 0 hours 29 minutes 10 seconds  Findings:                 The perianal and digital rectal examinations were                            normal.                           The terminal ileum appeared normal.  Two sessile polyps were found in the ascending                            colon. The polyps were 2 to 3 mm in size. These                            polyps were removed with a cold snare. Resection                            and retrieval were complete.                           A 3 mm polyp was found in the transverse colon. The                            polyp was sessile. The polyp was removed with a                            cold snare. Resection and retrieval were complete.                           There was 2 medium-sized lipomas, in the transverse                            colon.                           The colon was extremely tortuous with restricted                            mobility of the sigmoid colon - cecal intubation                            was challenging.                           A few small-mouthed diverticula were found in the                             transverse colon and ascending colon.                           Internal hemorrhoids were found during retroflexion.                           The exam was otherwise without abnormality. No                            abnormalities of the sigmoid colon noted. Complications:            No immediate complications. Estimated blood loss:                            Minimal. Estimated Blood Loss:     Estimated blood loss was  minimal. Impression:               - The examined portion of the ileum was normal.                           - Two 2 to 3 mm polyps in the ascending colon,                            removed with a cold snare. Resected and retrieved.                           - One 3 mm polyp in the transverse colon, removed                            with a cold snare. Resected and retrieved.                           - Medium-sized lipoma in the transverse colon.                           - Tortuous colon.                           - Diverticulosis in the transverse colon and in the                            ascending colon.                           - Internal hemorrhoids.                           - The examination was otherwise normal.                           No abnormalities on this exam to account for prior                            CT changes, which were likely representative of                            self limited inflammatory process, ? infetious vs.                            ischemic colitis? Recommendation:           - Patient has a contact number available for                            emergencies. The signs and symptoms of potential                            delayed complications were discussed with the                            patient.  Return to normal activities tomorrow.                            Written discharge instructions were provided to the                            patient.                           - Resume previous diet.                           -  Continue present medications.                           - Await pathology results.                           - Daily fiber supplement such as Citrucel to keep                            bowel movements regular                           - Follow up if symptoms recur Remo Lipps P. Dohn Stclair, MD 07/25/2019 11:20:05 AM This report has been signed electronically.

## 2019-07-25 NOTE — Progress Notes (Signed)
Pt's states no medical or surgical changes since previsit or office visit.  JB - temp DT - vitals 

## 2019-07-25 NOTE — Progress Notes (Signed)
No problems noted in the recovery room. maw 

## 2019-07-25 NOTE — Patient Instructions (Addendum)
YOU HAD AN ENDOSCOPIC PROCEDURE TODAY AT [facility]:   Refer to the procedure report that was given to you for any specific questions about what was found during the examination.  If the procedure report does not answer your questions, please call your gastroenterologist to clarify.  If you requested that your care partner not be given the details of your procedure findings, then the procedure report has been included in a sealed envelope for you to review at your convenience later.  YOU SHOULD EXPECT: Some feelings of bloating in the abdomen. Passage of more gas than usual.  Walking can help get rid of the air that was put into your GI tract during the procedure and reduce the bloating. If you had a lower endoscopy (such as a colonoscopy or flexible sigmoidoscopy) you may notice spotting of blood in your stool or on the toilet paper. If you underwent a bowel prep for your procedure, you may not have a normal bowel movement for a few days.  Please Note:  You might notice some irritation and congestion in your nose or some drainage.  This is from the oxygen used during your procedure.  There is no need for concern and it should clear up in a day or so.  SYMPTOMS TO REPORT IMMEDIATELY:   Following lower endoscopy (colonoscopy or flexible sigmoidoscopy):  Excessive amounts of blood in the stool  Significant tenderness or worsening of abdominal pains  Swelling of the abdomen that is new, acute  Fever of 100F or higher   For urgent or emergent issues, a gastroenterologist can be reached at any hour by calling [phone]. Do not use MyChart messaging for urgent concerns.    DIET:  We do recommend a small meal at first, but then you may proceed to your regular diet.  Drink plenty of fluids but you should avoid alcoholic beverages for 24 hours.  ACTIVITY:  You should plan to take it easy for the rest of today and you should NOT DRIVE or use heavy machinery until tomorrow (because  of the sedation medicines used during the test).    FOLLOW UP: Our staff will call the number listed on your records 48-72 hours following your procedure to check on you and address any questions or concerns that you may have regarding the information given to you following your procedure. If we do not reach you, we will leave a message.  We will attempt to reach you two times.  During this call, we will ask if you have developed any symptoms of COVID 19. If you develop any symptoms (ie: fever, flu-like symptoms, shortness of breath, cough etc.) before then, please call [phone].  If you test positive for Covid 19 in the 2 weeks post procedure, please call and report this information to [place].    If any biopsies were taken you will be contacted by phone or by letter within the next 1-3 weeks.  Please call us at [phone] if you have not heard about the biopsies in 3 weeks.    SIGNATURES/CONFIDENTIALITY: You and/or your care partner have signed paperwork which will be entered into your electronic medical record.  These signatures attest to the fact that that the information above on your After Visit Summary has been reviewed and is understood.  Full responsibility of the confidentiality of this discharge information lies with you and/or your care-partner.    Handouts were given to you on polyps, diverticulosis, and hemorrhoids. Add daily Over The Counter  Fiber supplement such as Citrucel to keep bowel movements regular. You may resume your current medications today. Await biopsy results. Please call if any questions or concerns.

## 2019-07-25 NOTE — Progress Notes (Signed)
Called to room to assist during endoscopic procedure.  Patient ID and intended procedure confirmed with present staff. Received instructions for my participation in the procedure from the performing physician.  

## 2019-07-25 NOTE — Progress Notes (Signed)
PT taken to PACU. Monitors in place. VSS. Report given to RN. 

## 2019-07-27 ENCOUNTER — Telehealth: Payer: Self-pay

## 2019-07-27 NOTE — Telephone Encounter (Signed)
  Follow up Call-  Call back number 07/25/2019  Post procedure Call Back phone  # [phone]  Permission to leave phone message Yes     Patient questions:  Do you have a fever, pain , or abdominal swelling? No. Pain Score  0 *  Have you tolerated food without any problems? Yes.    Have you been able to return to your normal activities? Yes.    Do you have any questions about your discharge instructions: Diet   No. Medications  No. Follow up visit  No.  Do you have questions or concerns about your Care? No.  Actions: * If pain score is 4 or above: No action needed, pain <4.  1. Have you developed a fever since your procedure? no  2.   Have you had an respiratory symptoms (SOB or cough) since your procedure? no  3.   Have you tested positive for COVID 19 since your procedure no  4.   Have you had any family members/close contacts diagnosed with the COVID 19 since your procedure?  no   If yes to any of these questions please route to Joylene John, RN and Erenest Rasher, RN

## 2019-08-01 ENCOUNTER — Ambulatory Visit (HOSPITAL_COMMUNITY)
Admission: RE | Admit: 2019-08-01 | Discharge: 2019-08-01 | Disposition: A | Discharge status: Home / Self Care | Payer: Federal, State, Local not specified - PPO | Source: Ambulatory Visit | Attending: Gastroenterology | Admitting: Gastroenterology

## 2019-08-01 ENCOUNTER — Other Ambulatory Visit: Payer: Self-pay | Admitting: Gastroenterology

## 2019-08-01 ENCOUNTER — Other Ambulatory Visit: Payer: Self-pay

## 2019-08-01 DIAGNOSIS — K8689 Other specified diseases of pancreas: Secondary | ICD-10-CM | POA: Insufficient documentation

## 2019-08-01 DIAGNOSIS — K869 Disease of pancreas, unspecified: Secondary | ICD-10-CM | POA: Diagnosis not present

## 2019-08-01 MED ORDER — GADOBUTROL 1 MMOL/ML IV SOLN
10.0000 mL | Freq: Once | INTRAVENOUS | Status: AC | PRN
  Administered 2019-08-01: 08:00:00 10 mL via INTRAVENOUS

## 2019-08-07 NOTE — Progress Notes (Signed)
Tiffany Martin - 59 y.o. female MRN [id number]  Date of birth: 1961/03/19  SUBJECTIVE:  Including CC & ROS.  Chief Complaint  Patient presents with  . Follow-up    right-sided low back  . Follow-up    neck    Tiffany Martin is a 59 y.o. female that is is following up for ongoing low back pain and right knee pain.  She did get improvement with the epidural but now pain has returned.  Pain seems mostly in the low back as opposed to previously being more radicular in nature.  The right knee is stable the time being.  Review of the MRI lumbar spine from 09/15/2017 shows L4-5 facet degeneration bilaterally with left greater than right. It shows L5-S1 facet joint degeneration with right worse than left.   Review of Systems See HPI   HISTORY: Past Medical, Surgical, Social, and Family History Reviewed & Updated per EMR.   Pertinent Historical Findings include:  Past Medical History:  Diagnosis Date  . Abdominal hernia   . Beta thalassemia ([place])   . GERD (gastroesophageal reflux disease)   . High blood pressure   . Irritable bowel syndrome   . Ischemic colitis ([place])   . Lactose intolerance     Past Surgical History:  Procedure Laterality Date  . ABDOMINAL HYSTERECTOMY    . APPENDECTOMY    . CESAREAN SECTION    . COLONOSCOPY    . KNEE SURGERY Right    x 3  . KNEE SURGERY Left    x 2  . ROTATOR CUFF REPAIR Left   . SHOULDER SURGERY Bilateral    for thoracic outlet syndrome  . TONSILLECTOMY    . UPPER GASTROINTESTINAL ENDOSCOPY      Family History  Problem Relation Age of Onset  . Cirrhosis Father        alcohol related  . Liver disease Sister        liver failure  . Colon cancer Neg Hx   . Esophageal cancer Neg Hx   . Stomach cancer Neg Hx   . Pancreatic cancer Neg Hx   . Rectal cancer Neg Hx     Social History   Socioeconomic History  . Marital status: Single    Spouse name: Not on file  . Number of children: Not on file  . Years of education: Not on file  .  Highest education level: Not on file  Occupational History  . Not on file  Tobacco Use  . Smoking status: Never Smoker  . Smokeless tobacco: Never Used  Substance and Sexual Activity  . Alcohol use: Never  . Drug use: Never  . Sexual activity: Not on file  Other Topics Concern  . Not on file  Social History Narrative  . Not on file   Social Determinants of Health   Financial Resource Strain:   . Difficulty of Paying Living Expenses:   Food Insecurity:   . Worried About Charity fundraiser in the Last Year:   . Arboriculturist in the Last Year:   Transportation Needs:   . Film/video editor (Medical):   [name] Lack of Transportation (Non-Medical):   Physical Activity:   . Days of Exercise per Week:   . Minutes of Exercise per Session:   Stress:   . Feeling of Stress :   Social Connections:   . Frequency of Communication with Friends and Family:   . Frequency of Social Gatherings with Friends and Family:   .  Attends Religious Services:   . Active Member of Clubs or Organizations:   . Attends Archivist Meetings:   [name] Marital Status:   Intimate Partner Violence:   . Fear of Current or Ex-Partner:   . Emotionally Abused:   [name] Physically Abused:   . Sexually Abused:      PHYSICAL EXAM:  VS: BP 116/73   Pulse 81   Ht 5\' 6"  (1.676 m)   Wt 220 lb (99.8 kg)   BMI 35.51 kg/m  Physical Exam Gen: NAD, alert, cooperative with exam, well-appearing MSK:  Back: Has good flexion and extension. No tenderness palpation over the lumbar midline spine. There are some tenderness palpation of the SI joints. Right knee: No obvious effusion. Is good range of motion. Neurovascularly intact     ASSESSMENT & PLAN:   Chronic right-sided low back pain with right-sided sciatica Has acutely gotten worse.  Has had an epidural since being here and has not gotten some improvement of her symptoms.  She has been receiving facet injections while in [place].  Does have different  facet changes in different areas on previous MRI from 2019. -Facet injection on right L5-S1 and left L4-5. -Could consider physical therapy. -Could consider SI joint injections.  OA (osteoarthritis) of knee Knee is stable for the time being. -She will start using the OA reaction brace. -Can consider injection, physical therapy or imaging.

## 2019-08-08 ENCOUNTER — Ambulatory Visit (INDEPENDENT_AMBULATORY_CARE_PROVIDER_SITE_OTHER): Payer: Federal, State, Local not specified - PPO | Admitting: Family Medicine

## 2019-08-08 ENCOUNTER — Other Ambulatory Visit: Payer: Self-pay

## 2019-08-08 ENCOUNTER — Encounter: Payer: Self-pay | Admitting: Family Medicine

## 2019-08-08 VITALS — BP 116/73 | HR 81 | Ht 66.0 in | Wt 220.0 lb

## 2019-08-08 DIAGNOSIS — G8929 Other chronic pain: Secondary | ICD-10-CM

## 2019-08-08 DIAGNOSIS — M5441 Lumbago with sciatica, right side: Secondary | ICD-10-CM | POA: Diagnosis not present

## 2019-08-08 DIAGNOSIS — M170 Bilateral primary osteoarthritis of knee: Secondary | ICD-10-CM

## 2019-08-08 NOTE — Assessment & Plan Note (Signed)
Knee is stable for the time being. -She will start using the OA reaction brace. -Can consider injection, physical therapy or imaging.

## 2019-08-08 NOTE — Assessment & Plan Note (Signed)
Has acutely gotten worse.  Has had an epidural since being here and has not gotten some improvement of her symptoms.  She has been receiving facet injections while in [place].  Does have different facet changes in different areas on previous MRI from 2019. -Facet injection on right L5-S1 and left L4-5. -Could consider physical therapy. -Could consider SI joint injections.

## 2019-08-08 NOTE — Patient Instructions (Signed)
Good to see you Good job with the walking.  We will try facet injection that will be done at [facility]. [id number]  Please try the knee brace Please send me a message in MyChart with any questions or updates.  Please see me back in 4-6 weeks.   --Dr. Raeford Razor

## 2019-08-27 ENCOUNTER — Other Ambulatory Visit: Payer: Self-pay | Admitting: Sports Medicine

## 2019-09-13 ENCOUNTER — Encounter: Payer: Self-pay | Admitting: Family Medicine

## 2019-09-13 ENCOUNTER — Ambulatory Visit (INDEPENDENT_AMBULATORY_CARE_PROVIDER_SITE_OTHER): Payer: Federal, State, Local not specified - PPO | Admitting: Family Medicine

## 2019-09-13 ENCOUNTER — Other Ambulatory Visit: Payer: Self-pay

## 2019-09-13 VITALS — BP 112/64 | HR 87 | Temp 97.4°F | Ht 66.0 in | Wt 217.4 lb

## 2019-09-13 DIAGNOSIS — I10 Essential (primary) hypertension: Secondary | ICD-10-CM

## 2019-09-13 DIAGNOSIS — E559 Vitamin D deficiency, unspecified: Secondary | ICD-10-CM | POA: Diagnosis not present

## 2019-09-13 DIAGNOSIS — D509 Iron deficiency anemia, unspecified: Secondary | ICD-10-CM | POA: Insufficient documentation

## 2019-09-13 DIAGNOSIS — E611 Iron deficiency: Secondary | ICD-10-CM

## 2019-09-13 DIAGNOSIS — J309 Allergic rhinitis, unspecified: Secondary | ICD-10-CM

## 2019-09-13 DIAGNOSIS — M5441 Lumbago with sciatica, right side: Secondary | ICD-10-CM

## 2019-09-13 DIAGNOSIS — G8929 Other chronic pain: Secondary | ICD-10-CM

## 2019-09-13 DIAGNOSIS — G894 Chronic pain syndrome: Secondary | ICD-10-CM | POA: Diagnosis not present

## 2019-09-13 DIAGNOSIS — Z8669 Personal history of other diseases of the nervous system and sense organs: Secondary | ICD-10-CM

## 2019-09-13 MED ORDER — FEXOFENADINE HCL 180 MG PO TABS: 180.0000 mg | ORAL_TABLET | Freq: Every day | ORAL | 3 refills | Status: DC

## 2019-09-13 MED ORDER — AMLODIPINE-OLMESARTAN 10-20 MG PO TABS: 1.0000 | ORAL_TABLET | Freq: Every day | ORAL | 1 refills | Status: DC

## 2019-09-13 MED ORDER — ONDANSETRON HCL 4 MG PO TABS: 4.0000 mg | ORAL_TABLET | Freq: Three times a day (TID) | ORAL | 2 refills | Status: DC | PRN

## 2019-09-13 MED ORDER — DULOXETINE HCL 60 MG PO CPEP: 60.0000 mg | ORAL_CAPSULE | Freq: Every day | ORAL | 1 refills | Status: AC

## 2019-09-13 MED ORDER — GABAPENTIN 600 MG PO TABS: 600.0000 mg | ORAL_TABLET | Freq: Three times a day (TID) | ORAL | 1 refills | Status: AC

## 2019-09-13 MED ORDER — CARVEDILOL 25 MG PO TABS: 25.0000 mg | ORAL_TABLET | Freq: Two times a day (BID) | ORAL | 2 refills | Status: DC

## 2019-09-13 MED ORDER — TRAMADOL HCL 50 MG PO TABS: 100.0000 mg | ORAL_TABLET | Freq: Three times a day (TID) | ORAL | 0 refills | Status: DC | PRN

## 2019-09-13 MED ORDER — SUMATRIPTAN SUCCINATE 50 MG PO TABS: 50.0000 mg | ORAL_TABLET | ORAL | 2 refills | Status: DC | PRN

## 2019-09-13 NOTE — Progress Notes (Signed)
Established Patient Office Visit  Subjective:  Patient ID: Tiffany Martin, female    DOB: [birth year]  Age: 59 y.o. MRN: [id number]  CC:  Chief Complaint  Patient presents with  . Follow-up    Patient is here today for a 48-month-F/U.  At 12.10.20 visit she was advised to continue amlodipine, olmesartan, and carvedilol for HTN and she is compliant. Referral created to sports medicine and sees Dr. Raeford Razor. Insurance doesn't cover Pain Management so is going to go to [facility] on 6.23.21. Advised to F/U with [facility] for neuropathies.  Labs revealed low Fe and pt instructed to take Fe qd and is compliant. Is requesting a Handicap Plcard.    HPI Martin Tiffany presents for follow-up of her hypertension, vitamin D deficiency, iron deficiency with anemia, allergy rhinitis, history of migraine and chronic lumbago.  Has follow-up scheduled with her new pain clinic in a couple weeks.  Requests refill on her tramadol.  Pain in her back is been causing her some sadness.  She does continue to take the Cymbalta.  She is exercising.  Reports compliance with her iron and vitamin D.  She has had her Covid vaccine.  Past Medical History:  Diagnosis Date  . Abdominal hernia   . Beta thalassemia ([place])   . GERD (gastroesophageal reflux disease)   . High blood pressure   . Irritable bowel syndrome   . Ischemic colitis ([place])   . Lactose intolerance     Past Surgical History:  Procedure Laterality Date  . ABDOMINAL HYSTERECTOMY    . APPENDECTOMY    . CESAREAN SECTION    . COLONOSCOPY    . KNEE SURGERY Right    x 3  . KNEE SURGERY Left    x 2  . ROTATOR CUFF REPAIR Left   . SHOULDER SURGERY Bilateral    for thoracic outlet syndrome  . TONSILLECTOMY    . UPPER GASTROINTESTINAL ENDOSCOPY      Family History  Problem Relation Age of Onset  . Cirrhosis Father        alcohol related  . Liver disease Sister        liver failure  . Colon cancer Neg Hx   . Esophageal cancer Neg Hx   .  Stomach cancer Neg Hx   . Pancreatic cancer Neg Hx   . Rectal cancer Neg Hx     Social History   Socioeconomic History  . Marital status: Single    Spouse name: Not on file  . Number of children: Not on file  . Years of education: Not on file  . Highest education level: Not on file  Occupational History  . Not on file  Tobacco Use  . Smoking status: Never Smoker  . Smokeless tobacco: Never Used  Vaping Use  . Vaping Use: Never used  Substance and Sexual Activity  . Alcohol use: Never  . Drug use: Never  . Sexual activity: Not on file  Other Topics Concern  . Not on file  Social History Narrative  . Not on file   Social Determinants of Health   Financial Resource Strain:   . Difficulty of Paying Living Expenses:   Food Insecurity:   . Worried About Charity fundraiser in the Last Year:   . Arboriculturist in the Last Year:   Transportation Needs:   . Film/video editor (Medical):   [name] Lack of Transportation (Non-Medical):   Physical Activity:   . Days of  Exercise per Week:   . Minutes of Exercise per Session:   Stress:   . Feeling of Stress :   Social Connections:   . Frequency of Communication with Friends and Family:   . Frequency of Social Gatherings with Friends and Family:   . Attends Religious Services:   . Active Member of Clubs or Organizations:   . Attends Archivist Meetings:   [name] Marital Status:   Intimate Partner Violence:   . Fear of Current or Ex-Partner:   . Emotionally Abused:   [name] Physically Abused:   . Sexually Abused:     Outpatient Medications Prior to Visit  Medication Sig Dispense Refill  . acetaminophen (TYLENOL) 500 MG tablet Take 1,000 mg by mouth every 8 (eight) hours as needed.    [name] aspirin 81 MG chewable tablet Chew 1 tablet by mouth daily.    . B Complex-Biotin-FA (B-100 COMPLEX PO) Take 1 capsule by mouth daily.    . Cholecalciferol (VITAMIN D3) 25 MCG (1000 UT) CAPS Take 1 capsule by mouth daily.    .  cyclobenzaprine (FLEXERIL) 10 MG tablet Take 10 mg by mouth 3 (three) times daily.    [name] DIGESTIVE ENZYMES PO Take 1 capsule by mouth 2 (two) times daily with a meal.    . ferrous sulfate 325 (65 FE) MG tablet Take 1 tablet by mouth daily.    . fluticasone (FLONASE) 50 MCG/ACT nasal spray Place into the nose.    [name] LINZESS 290 MCG CAPS capsule Take 290 mcg by mouth daily.    . nitroGLYCERIN (NITROSTAT) 0.4 MG SL tablet Place under the tongue as needed.    . vitamin C (ASCORBIC ACID) 500 MG tablet Take 500 mg by mouth daily.    [name] amlodipine-olmesartan (AZOR) 10-20 MG tablet Take 1 tablet by mouth daily. 90 tablet 2  . carvedilol (COREG) 25 MG tablet Take 1 tablet (25 mg total) by mouth 2 (two) times daily. 180 tablet 2  . DULoxetine (CYMBALTA) 60 MG capsule Take 1 capsule (60 mg total) by mouth daily. 90 capsule 1  . fexofenadine (ALLEGRA) 180 MG tablet Take 1 tablet (180 mg total) by mouth daily. 90 tablet 1  . gabapentin (NEURONTIN) 600 MG tablet Take 1 tablet (600 mg total) by mouth 3 (three) times daily. 270 tablet 1  . ondansetron (ZOFRAN) 4 MG tablet Take 1 tablet by mouth every 8 (eight) hours as needed.    . SUMAtriptan (IMITREX) 50 MG tablet Take 50 mg by mouth every 2 (two) hours as needed for migraine. May repeat in 2 hours if headache persists or recurs.    . traMADol (ULTRAM) 50 MG tablet Take 2 tablets (100 mg total) by mouth every 8 (eight) hours as needed. 90 tablet 1  . methocarbamol (ROBAXIN) 500 MG tablet Take 1 tablet (500 mg total) by mouth 2 (two) times daily as needed for muscle spasms. 60 tablet 1  . Probiotic Product (DIGESTIVE ADVANTAGE GUMMIES PO) Take 2 tablets by mouth daily.     No facility-administered medications prior to visit.    Allergies  Allergen Reactions  . Pork-Derived Products Anaphylaxis  . Codeine Nausea And Vomiting    documented per [facility] paper chart/ EMR   . Morphine Nausea And Vomiting  . Penicillins Hives and Rash  . Aspirin   . Baclofen   .  Nsaids   . Latex Rash    ROS Review of Systems  Constitutional: Negative.   HENT: Positive for postnasal drip  and sneezing.   Eyes: Negative for photophobia and visual disturbance.  Respiratory: Negative.   Cardiovascular: Negative.   Gastrointestinal: Negative.   Genitourinary: Negative.   Musculoskeletal: Positive for back pain. Negative for gait problem.  Neurological: Negative for tremors and speech difficulty.  Hematological: Does not bruise/bleed easily.   Depression screen [facility] 2/9 09/13/2019 03/15/2019  Decreased Interest 1 0  Down, Depressed, Hopeless 1 0  PHQ - 2 Score 2 0  Altered sleeping 2 -  Tired, decreased energy 2 -  Change in appetite 1 -  Feeling bad or failure about yourself  1 -  Trouble concentrating 2 -  Moving slowly or fidgety/restless 1 -  Suicidal thoughts 0 -  PHQ-9 Score 11 -  Difficult doing work/chores Somewhat difficult -      Objective:    Physical Exam Vitals and nursing note reviewed.  Constitutional:      General: She is not in acute distress.    Appearance: Normal appearance. She is not ill-appearing, toxic-appearing or diaphoretic.  HENT:     Head: Normocephalic and atraumatic.     Nose: Nose normal.  Eyes:     General: No scleral icterus.       Right eye: No discharge.        Left eye: No discharge.     Extraocular Movements: Extraocular movements intact.     Conjunctiva/sclera: Conjunctivae normal.     Pupils: Pupils are equal, round, and reactive to light.  Cardiovascular:     Rate and Rhythm: Normal rate and regular rhythm.  Pulmonary:     Effort: Pulmonary effort is normal.     Breath sounds: Normal breath sounds.  Abdominal:     General: Abdomen is flat. Bowel sounds are normal.     Palpations: Abdomen is soft.  Musculoskeletal:     Cervical back: No rigidity or tenderness.     Right lower leg: No edema.     Left lower leg: No edema.  Lymphadenopathy:     Cervical: No cervical adenopathy.  Skin:    General:  Skin is warm and dry.  Neurological:     Mental Status: She is alert and oriented to person, place, and time.     BP 112/64 (BP Location: Left Arm, Patient Position: Sitting, Cuff Size: Large)   Pulse 87   Temp (!) 97.4 F (36.3 C) (Temporal)   Ht 5\' 6"  (1.676 m)   Wt 217 lb 6.4 oz (98.6 kg)   SpO2 97%   BMI 35.09 kg/m  Wt Readings from Last 3 Encounters:  09/13/19 217 lb 6.4 oz (98.6 kg)  08/08/19 220 lb (99.8 kg)  07/25/19 220 lb (99.8 kg)     Health Maintenance Due  Topic Date Due  . Hepatitis C Screening  Never done  . COVID-19 Vaccine (1) Never done  . HIV Screening  Never done  . TETANUS/TDAP  Never done  . PAP SMEAR-Modifier  Never done    There are no preventive care reminders to display for this patient.  Lab Results  Component Value Date   TSH 1.20 03/05/2019   Lab Results  Component Value Date   WBC 6.8 03/20/2019   HGB 11.7 (L) 03/20/2019   HCT 37.6 03/20/2019   MCV 72.0 (L) 03/20/2019   PLT 357.0 03/20/2019   Lab Results  Component Value Date   NA 143 03/20/2019   K 4.4 03/20/2019   CO2 31 03/20/2019   GLUCOSE 104 (H) 03/20/2019   BUN 11  03/20/2019   CREATININE 0.77 03/20/2019   BILITOT 0.5 03/20/2019   ALKPHOS 71 03/20/2019   AST 15 03/20/2019   ALT 15 03/20/2019   PROT 7.2 03/20/2019   ALBUMIN 4.2 03/20/2019   CALCIUM 9.5 03/20/2019   GFR 92.50 03/20/2019   Lab Results  Component Value Date   CHOL 157 03/20/2019   Lab Results  Component Value Date   HDL 47.70 03/20/2019   Lab Results  Component Value Date   LDLCALC 78 03/20/2019   Lab Results  Component Value Date   TRIG 160.0 (H) 03/20/2019   Lab Results  Component Value Date   CHOLHDL 3 03/20/2019   No results found for: HGBA1C    Assessment & Plan:   Problem List Items Addressed This Visit      Cardiovascular and Mediastinum   Essential hypertension   Relevant Medications   carvedilol (COREG) 25 MG tablet   amlodipine-olmesartan (AZOR) 10-20 MG tablet      Respiratory   Allergic rhinitis   Relevant Medications   fexofenadine (ALLEGRA) 180 MG tablet     Nervous and Auditory   Chronic right-sided low back pain with right-sided sciatica   Relevant Medications   traMADol (ULTRAM) 50 MG tablet   gabapentin (NEURONTIN) 600 MG tablet   DULoxetine (CYMBALTA) 60 MG capsule     Other   Vitamin D deficiency - Primary   Relevant Orders   VITAMIN D 25 Hydroxy (Vit-D Deficiency, Fractures)   History of migraine   Relevant Medications   ondansetron (ZOFRAN) 4 MG tablet   SUMAtriptan (IMITREX) 50 MG tablet   Chronic pain syndrome   Relevant Medications   traMADol (ULTRAM) 50 MG tablet   gabapentin (NEURONTIN) 600 MG tablet   DULoxetine (CYMBALTA) 60 MG capsule   Iron deficiency   Relevant Orders   Iron, TIBC and Ferritin Panel   Microcytic anemia   Relevant Orders   CBC      Meds ordered this encounter  Medications  . ondansetron (ZOFRAN) 4 MG tablet    Sig: Take 1 tablet (4 mg total) by mouth every 8 (eight) hours as needed.    Dispense:  20 tablet    Refill:  2  . SUMAtriptan (IMITREX) 50 MG tablet    Sig: Take 1 tablet (50 mg total) by mouth every 2 (two) hours as needed for migraine. May repeat once in 2 hours if headache persists or recurs.    Dispense:  10 tablet    Refill:  2  . traMADol (ULTRAM) 50 MG tablet    Sig: Take 2 tablets (100 mg total) by mouth every 8 (eight) hours as needed.    Dispense:  90 tablet    Refill:  0  . fexofenadine (ALLEGRA) 180 MG tablet    Sig: Take 1 tablet (180 mg total) by mouth daily.    Dispense:  90 tablet    Refill:  3  . gabapentin (NEURONTIN) 600 MG tablet    Sig: Take 1 tablet (600 mg total) by mouth 3 (three) times daily.    Dispense:  270 tablet    Refill:  1  . carvedilol (COREG) 25 MG tablet    Sig: Take 1 tablet (25 mg total) by mouth 2 (two) times daily.    Dispense:  180 tablet    Refill:  2  . DULoxetine (CYMBALTA) 60 MG capsule    Sig: Take 1 capsule (60 mg total) by  mouth daily.    Dispense:  90  capsule    Refill:  1  . amlodipine-olmesartan (AZOR) 10-20 MG tablet    Sig: Take 1 tablet by mouth daily.    Dispense:  90 tablet    Refill:  1    Follow-up: Return in about 3 months (around 12/14/2019).  Considered augmentation for depression.  At this point believe it is mostly secondary to her pain.  Follow-up in 3 months to see how she is doing. Libby Maw, MD

## 2019-09-19 ENCOUNTER — Ambulatory Visit: Payer: Federal, State, Local not specified - PPO | Admitting: Family Medicine

## 2019-09-26 DIAGNOSIS — M47816 Spondylosis without myelopathy or radiculopathy, lumbar region: Secondary | ICD-10-CM | POA: Diagnosis not present

## 2019-09-26 DIAGNOSIS — M47812 Spondylosis without myelopathy or radiculopathy, cervical region: Secondary | ICD-10-CM | POA: Diagnosis not present

## 2019-09-26 DIAGNOSIS — M5416 Radiculopathy, lumbar region: Secondary | ICD-10-CM | POA: Diagnosis not present

## 2019-09-26 DIAGNOSIS — Z79891 Long term (current) use of opiate analgesic: Secondary | ICD-10-CM | POA: Diagnosis not present

## 2019-09-26 DIAGNOSIS — G894 Chronic pain syndrome: Secondary | ICD-10-CM | POA: Diagnosis not present

## 2019-10-01 ENCOUNTER — Other Ambulatory Visit: Payer: Self-pay

## 2019-10-01 ENCOUNTER — Other Ambulatory Visit (INDEPENDENT_AMBULATORY_CARE_PROVIDER_SITE_OTHER): Payer: Federal, State, Local not specified - PPO

## 2019-10-01 DIAGNOSIS — E559 Vitamin D deficiency, unspecified: Secondary | ICD-10-CM

## 2019-10-01 DIAGNOSIS — D509 Iron deficiency anemia, unspecified: Secondary | ICD-10-CM | POA: Diagnosis not present

## 2019-10-01 DIAGNOSIS — E611 Iron deficiency: Secondary | ICD-10-CM | POA: Diagnosis not present

## 2019-10-01 LAB — CBC
HCT: 35.5 % — ABNORMAL LOW (ref 36.0–46.0)
Hemoglobin: 11.2 g/dL — ABNORMAL LOW (ref 12.0–15.0)
MCHC: 31.6 g/dL (ref 30.0–36.0)
MCV: 71.8 fl — ABNORMAL LOW (ref 78.0–100.0)
Platelets: 350 10*3/uL (ref 150.0–400.0)
RBC: 4.94 Mil/uL (ref 3.87–5.11)
RDW: 17.9 % — ABNORMAL HIGH (ref 11.5–15.5)
WBC: 5.8 10*3/uL (ref 4.0–10.5)

## 2019-10-01 LAB — VITAMIN D 25 HYDROXY (VIT D DEFICIENCY, FRACTURES): VITD: 39.45 ng/mL (ref 30.00–100.00)

## 2019-10-02 LAB — IRON,TIBC AND FERRITIN PANEL
%SAT: 25 % (calc) (ref 16–45)
Ferritin: 104 ng/mL (ref 16–232)
Iron: 72 ug/dL (ref 45–160)
TIBC: 286 mcg/dL (calc) (ref 250–450)

## 2019-10-03 ENCOUNTER — Ambulatory Visit: Payer: Federal, State, Local not specified - PPO | Admitting: Family Medicine

## 2019-10-11 DIAGNOSIS — M5416 Radiculopathy, lumbar region: Secondary | ICD-10-CM | POA: Diagnosis not present

## 2019-10-24 DIAGNOSIS — G894 Chronic pain syndrome: Secondary | ICD-10-CM | POA: Diagnosis not present

## 2019-10-24 DIAGNOSIS — M5416 Radiculopathy, lumbar region: Secondary | ICD-10-CM | POA: Diagnosis not present

## 2019-10-24 DIAGNOSIS — M47812 Spondylosis without myelopathy or radiculopathy, cervical region: Secondary | ICD-10-CM | POA: Diagnosis not present

## 2019-10-24 DIAGNOSIS — M47816 Spondylosis without myelopathy or radiculopathy, lumbar region: Secondary | ICD-10-CM | POA: Diagnosis not present

## 2019-11-06 ENCOUNTER — Other Ambulatory Visit: Payer: Self-pay

## 2019-11-06 MED ORDER — PROMETHAZINE HCL 25 MG PO TABS
25.0000 mg | ORAL_TABLET | Freq: Four times a day (QID) | ORAL | 5 refills | Status: DC | PRN
Start: 2019-11-06 — End: 2020-01-17

## 2019-11-29 DIAGNOSIS — M47812 Spondylosis without myelopathy or radiculopathy, cervical region: Secondary | ICD-10-CM | POA: Diagnosis not present

## 2019-11-29 DIAGNOSIS — M47816 Spondylosis without myelopathy or radiculopathy, lumbar region: Secondary | ICD-10-CM | POA: Diagnosis not present

## 2019-11-29 DIAGNOSIS — M5416 Radiculopathy, lumbar region: Secondary | ICD-10-CM | POA: Diagnosis not present

## 2019-11-29 DIAGNOSIS — G894 Chronic pain syndrome: Secondary | ICD-10-CM | POA: Diagnosis not present

## 2019-12-17 ENCOUNTER — Other Ambulatory Visit: Payer: Self-pay

## 2019-12-17 ENCOUNTER — Ambulatory Visit (INDEPENDENT_AMBULATORY_CARE_PROVIDER_SITE_OTHER): Payer: Federal, State, Local not specified - PPO

## 2019-12-17 ENCOUNTER — Encounter: Payer: Self-pay | Admitting: Family Medicine

## 2019-12-17 ENCOUNTER — Ambulatory Visit (INDEPENDENT_AMBULATORY_CARE_PROVIDER_SITE_OTHER): Payer: Federal, State, Local not specified - PPO | Admitting: Family Medicine

## 2019-12-17 VITALS — BP 118/70 | HR 74 | Temp 97.2°F | Ht 66.0 in | Wt 212.4 lb

## 2019-12-17 DIAGNOSIS — M8480 Other disorders of continuity of bone, unspecified site: Secondary | ICD-10-CM

## 2019-12-17 DIAGNOSIS — E611 Iron deficiency: Secondary | ICD-10-CM | POA: Diagnosis not present

## 2019-12-17 DIAGNOSIS — E538 Deficiency of other specified B group vitamins: Secondary | ICD-10-CM

## 2019-12-17 DIAGNOSIS — M25561 Pain in right knee: Secondary | ICD-10-CM | POA: Insufficient documentation

## 2019-12-17 DIAGNOSIS — E519 Thiamine deficiency, unspecified: Secondary | ICD-10-CM | POA: Diagnosis not present

## 2019-12-17 DIAGNOSIS — Z23 Encounter for immunization: Secondary | ICD-10-CM

## 2019-12-17 DIAGNOSIS — D509 Iron deficiency anemia, unspecified: Secondary | ICD-10-CM

## 2019-12-17 DIAGNOSIS — G4733 Obstructive sleep apnea (adult) (pediatric): Secondary | ICD-10-CM

## 2019-12-17 DIAGNOSIS — D169 Benign neoplasm of bone and articular cartilage, unspecified: Secondary | ICD-10-CM

## 2019-12-17 DIAGNOSIS — E559 Vitamin D deficiency, unspecified: Secondary | ICD-10-CM | POA: Diagnosis not present

## 2019-12-17 DIAGNOSIS — I10 Essential (primary) hypertension: Secondary | ICD-10-CM

## 2019-12-17 LAB — COMPREHENSIVE METABOLIC PANEL
ALT: 16 U/L (ref 0–35)
AST: 16 U/L (ref 0–37)
Albumin: 4.7 g/dL (ref 3.5–5.2)
Alkaline Phosphatase: 83 U/L (ref 39–117)
BUN: 10 mg/dL (ref 6–23)
CO2: 30 mEq/L (ref 19–32)
Calcium: 10.1 mg/dL (ref 8.4–10.5)
Chloride: 102 mEq/L (ref 96–112)
Creatinine, Ser: 0.92 mg/dL (ref 0.40–1.20)
GFR: 75.14 mL/min (ref >60.00)
Glucose, Bld: 115 mg/dL — ABNORMAL HIGH (ref 70–99)
Potassium: 4.4 mEq/L (ref 3.5–5.1)
Sodium: 142 mEq/L (ref 135–145)
Total Bilirubin: 0.5 mg/dL (ref 0.2–1.2)
Total Protein: 7.6 g/dL (ref 6.0–8.3)

## 2019-12-17 LAB — URINALYSIS, ROUTINE W REFLEX MICROSCOPIC
Bilirubin Urine: NEGATIVE
Hgb urine dipstick: NEGATIVE
Leukocytes,Ua: NEGATIVE
Nitrite: NEGATIVE
Specific Gravity, Urine: 1.02 (ref 1.000–1.030)
Total Protein, Urine: NEGATIVE
Urine Glucose: NEGATIVE
Urobilinogen, UA: 0.2 (ref 0.0–1.0)
pH: 6 (ref 5.0–8.0)

## 2019-12-17 LAB — CBC
HCT: 36.6 % (ref 36.0–46.0)
Hemoglobin: 11.6 g/dL — ABNORMAL LOW (ref 12.0–15.0)
MCHC: 31.6 g/dL (ref 30.0–36.0)
MCV: 71.3 fl — ABNORMAL LOW (ref 78.0–100.0)
Platelets: 374 10*3/uL (ref 150.0–400.0)
RBC: 5.14 Mil/uL — ABNORMAL HIGH (ref 3.87–5.11)
RDW: 18.8 % — ABNORMAL HIGH (ref 11.5–15.5)
WBC: 7.6 10*3/uL (ref 4.0–10.5)

## 2019-12-17 LAB — VITAMIN B12: Vitamin B-12: 594 pg/mL (ref 211–911)

## 2019-12-17 LAB — VITAMIN D 25 HYDROXY (VIT D DEFICIENCY, FRACTURES): VITD: 38.67 ng/mL (ref 30.00–100.00)

## 2019-12-17 NOTE — Progress Notes (Addendum)
Established Patient Office Visit  Subjective:  Patient ID: Tiffany Martin, female    DOB: 1960/06/19  Age: 59 y.o. MRN: [id number]  CC:  Chief Complaint  Patient presents with  . Follow-up    3 month follow up, patient not sure if she should have shingles vaccine. C/O right knee pain.     HPI [name] presents for follow-up of her hypertension, depression, vitamin D deficiency, vitamin B1 and B12 deficiency, anemia and a new problem with right knee pain.  She has had multiple surgeries on the right knee.  Her knee swells.  It has not recently locked up or giving way.  Pain management suggested that she try Voltaren cream.  It has not helped.  Pain management increased her Cymbalta to 90 mg daily.  Blood pressure continues to do well with Azor and carvedilol.  She has been able to lose some weight.  Still struggling with depression that hopefully will respond to the increased dosage of duloxetine.  She believes she was placed on thiamine by her [place] doctor.  She has never been a heavy drinker.  Continues to take B complex.  She does claim compliance with her iron tablets.  She needs follow-up with a sleep doctor for her apnea.  She has has been trying and has been successful at losing some weight!  Past Medical History:  Diagnosis Date  . Abdominal hernia   . Beta thalassemia ([place])   . GERD (gastroesophageal reflux disease)   . High blood pressure   . Irritable bowel syndrome   . Ischemic colitis ([place])   . Lactose intolerance     Past Surgical History:  Procedure Laterality Date  . ABDOMINAL HYSTERECTOMY    . APPENDECTOMY    . CESAREAN SECTION    . COLONOSCOPY    . KNEE SURGERY Right    x 3  . KNEE SURGERY Left    x 2  . ROTATOR CUFF REPAIR Left   . SHOULDER SURGERY Bilateral    for thoracic outlet syndrome  . TONSILLECTOMY    . UPPER GASTROINTESTINAL ENDOSCOPY      Family History  Problem Relation Age of Onset  . Cirrhosis Father        alcohol related  .  Liver disease Sister        liver failure  . Colon cancer Neg Hx   . Esophageal cancer Neg Hx   . Stomach cancer Neg Hx   . Pancreatic cancer Neg Hx   . Rectal cancer Neg Hx     Social History   Socioeconomic History  . Marital status: Single    Spouse name: Not on file  . Number of children: Not on file  . Years of education: Not on file  . Highest education level: Not on file  Occupational History  . Not on file  Tobacco Use  . Smoking status: Never Smoker  . Smokeless tobacco: Never Used  Vaping Use  . Vaping Use: Never used  Substance and Sexual Activity  . Alcohol use: Never  . Drug use: Never  . Sexual activity: Not on file  Other Topics Concern  . Not on file  Social History Narrative  . Not on file   Social Determinants of Health   Financial Resource Strain:   . Difficulty of Paying Living Expenses: Not on file  Food Insecurity:   . Worried About Charity fundraiser in the Last Year: Not on file  . Ran Out of Food  in the Last Year: Not on file  Transportation Needs:   . Lack of Transportation (Medical): Not on file  . Lack of Transportation (Non-Medical): Not on file  Physical Activity:   . Days of Exercise per Week: Not on file  . Minutes of Exercise per Session: Not on file  Stress:   . Feeling of Stress : Not on file  Social Connections:   . Frequency of Communication with Friends and Family: Not on file  . Frequency of Social Gatherings with Friends and Family: Not on file  . Attends Religious Services: Not on file  . Active Member of Clubs or Organizations: Not on file  . Attends Archivist Meetings: Not on file  . Marital Status: Not on file  Intimate Partner Violence:   . Fear of Current or Ex-Partner: Not on file  . Emotionally Abused: Not on file  . Physically Abused: Not on file  . Sexually Abused: Not on file    Outpatient Medications Prior to Visit  Medication Sig Dispense Refill  . acetaminophen (TYLENOL) 500 MG tablet  Take 1,000 mg by mouth every 8 (eight) hours as needed.    [name] amlodipine-olmesartan (AZOR) 10-20 MG tablet Take 1 tablet by mouth daily. 90 tablet 1  . aspirin 81 MG chewable tablet Chew 1 tablet by mouth daily.    . B Complex-Biotin-FA (B-100 COMPLEX PO) Take 1 capsule by mouth daily.    . benzonatate (TESSALON) 200 MG capsule Take 200 mg by mouth 3 (three) times daily.    . carvedilol (COREG) 25 MG tablet Take 1 tablet (25 mg total) by mouth 2 (two) times daily. 180 tablet 2  . Cholecalciferol (VITAMIN D3) 25 MCG (1000 UT) CAPS Take 1 capsule by mouth daily.    . diclofenac Sodium (VOLTAREN) 1 % GEL Apply 4 g topically 4 (four) times daily.    [name] DIGESTIVE ENZYMES PO Take 1 capsule by mouth 2 (two) times daily with a meal.    . DULoxetine (CYMBALTA) 60 MG capsule Take 1 capsule (60 mg total) by mouth daily. (Patient taking differently: Take 60 mg by mouth daily. Patient taking 90 mg daily) 90 capsule 1  . ferrous sulfate 325 (65 FE) MG tablet Take 1 tablet by mouth daily.    . fexofenadine (ALLEGRA) 180 MG tablet Take 1 tablet (180 mg total) by mouth daily. 90 tablet 3  . fluticasone (FLONASE) 50 MCG/ACT nasal spray Place into the nose.    . gabapentin (NEURONTIN) 600 MG tablet Take 1 tablet (600 mg total) by mouth 3 (three) times daily. 270 tablet 1  . LINZESS 290 MCG CAPS capsule Take 290 mcg by mouth daily.    . meloxicam (MOBIC) 15 MG tablet Take 15 mg by mouth daily.    . Omeprazole 20 MG TBEC Take 1 tablet by mouth daily.    . promethazine (PHENERGAN) 25 MG tablet Take 1 tablet (25 mg total) by mouth every 6 (six) hours as needed for nausea or vomiting. 30 tablet 5  . SUMAtriptan (IMITREX) 50 MG tablet Take 1 tablet (50 mg total) by mouth every 2 (two) hours as needed for migraine. May repeat once in 2 hours if headache persists or recurs. 10 tablet 2  . Thiamine HCl (VITAMIN B-1) 250 MG tablet Take 250 mg by mouth daily.    [name] tiZANidine (ZANAFLEX) 4 MG tablet Take 4 mg by mouth 3 (three)  times daily as needed.    . traMADol (ULTRAM) 50 MG tablet  Take 2 tablets (100 mg total) by mouth every 8 (eight) hours as needed. 90 tablet 0  . vitamin C (ASCORBIC ACID) 500 MG tablet Take 500 mg by mouth daily.    . cyclobenzaprine (FLEXERIL) 10 MG tablet Take 10 mg by mouth 3 (three) times daily. (Patient not taking: Reported on 12/17/2019)    . nitroGLYCERIN (NITROSTAT) 0.4 MG SL tablet Place under the tongue as needed. (Patient not taking: Reported on 12/17/2019)    . ondansetron (ZOFRAN) 4 MG tablet Take 1 tablet (4 mg total) by mouth every 8 (eight) hours as needed. (Patient not taking: Reported on 12/17/2019) 20 tablet 2   No facility-administered medications prior to visit.    Allergies  Allergen Reactions  . Pork-Derived Products Anaphylaxis  . Codeine Nausea And Vomiting    documented per BFML paper chart/ EMR   . Morphine Nausea And Vomiting  . Penicillins Hives and Rash  . Aspirin   . Baclofen   . Nsaids   . Latex Rash    ROS Review of Systems  Constitutional: Negative.   HENT: Negative.   Respiratory: Negative.   Cardiovascular: Negative.   Gastrointestinal: Negative.   Endocrine: Negative for polyphagia and polyuria.  Genitourinary: Negative.   Musculoskeletal: Positive for arthralgias and back pain.  Allergic/Immunologic: Negative for immunocompromised state.  Neurological: Negative for tremors and speech difficulty.  Hematological: Does not bruise/bleed easily.   Depression screen [facility] 2/9 12/17/2019 09/13/2019 03/15/2019  Decreased Interest 1 1 0  Down, Depressed, Hopeless 0 1 0  PHQ - 2 Score 1 2 0  Altered sleeping 2 2 -  Tired, decreased energy 3 2 -  Change in appetite 1 1 -  Feeling bad or failure about yourself  1 1 -  Trouble concentrating 1 2 -  Moving slowly or fidgety/restless 0 1 -  Suicidal thoughts 0 0 -  PHQ-9 Score 9 11 -  Difficult doing work/chores Somewhat difficult Somewhat difficult -      Objective:    Physical Exam Vitals and  nursing note reviewed.  Constitutional:      General: She is not in acute distress.    Appearance: Normal appearance. She is obese. She is not ill-appearing, toxic-appearing or diaphoretic.  HENT:     Head: Normocephalic and atraumatic.     Right Ear: External ear normal.     Left Ear: External ear normal.     Mouth/Throat:     Mouth: Mucous membranes are moist.     Pharynx: Oropharynx is clear. No oropharyngeal exudate or posterior oropharyngeal erythema.   Eyes:     General: No scleral icterus.       Right eye: No discharge.        Left eye: No discharge.     Extraocular Movements: Extraocular movements intact.     Conjunctiva/sclera: Conjunctivae normal.     Pupils: Pupils are equal, round, and reactive to light.  Cardiovascular:     Rate and Rhythm: Normal rate and regular rhythm.  Pulmonary:     Effort: Pulmonary effort is normal.     Breath sounds: Normal breath sounds.  Abdominal:     General: Bowel sounds are normal.  Musculoskeletal:     Cervical back: Normal range of motion. No rigidity or tenderness.     Right lower leg: No edema.     Left lower leg: No edema.  Lymphadenopathy:     Cervical: No cervical adenopathy.  Skin:    General: Skin is dry.  Neurological:     Mental Status: She is alert and oriented to person, place, and time.  Psychiatric:        Mood and Affect: Mood normal.        Behavior: Behavior normal.     BP 118/70   Pulse 74   Temp (!) 97.2 F (36.2 C) (Tympanic)   Ht 5\' 6"  (1.676 m)   Wt 212 lb 6.4 oz (96.3 kg)   BMI 34.28 kg/m  Wt Readings from Last 3 Encounters:  12/17/19 212 lb 6.4 oz (96.3 kg)  09/13/19 217 lb 6.4 oz (98.6 kg)  08/08/19 220 lb (99.8 kg)     Health Maintenance Due  Topic Date Due  . Hepatitis C Screening  Never done  . HIV Screening  Never done  . TETANUS/TDAP  Never done  . PAP SMEAR-Modifier  Never done    There are no preventive care reminders to display for this patient.  Lab Results  Component  Value Date   TSH 1.20 03/05/2019   Lab Results  Component Value Date   WBC 7.6 12/17/2019   HGB 11.6 (L) 12/17/2019   HCT 36.6 12/17/2019   MCV 71.3 (L) 12/17/2019   PLT 374.0 12/17/2019   Lab Results  Component Value Date   NA 142 12/17/2019   K 4.4 12/17/2019   CO2 30 12/17/2019   GLUCOSE 115 (H) 12/17/2019   BUN 10 12/17/2019   CREATININE 0.92 12/17/2019   BILITOT 0.5 12/17/2019   ALKPHOS 83 12/17/2019   AST 16 12/17/2019   ALT 16 12/17/2019   PROT 7.6 12/17/2019   ALBUMIN 4.7 12/17/2019   CALCIUM 10.1 12/17/2019   GFR 75.14 12/17/2019   Lab Results  Component Value Date   CHOL 157 03/20/2019   Lab Results  Component Value Date   HDL 47.70 03/20/2019   Lab Results  Component Value Date   LDLCALC 78 03/20/2019   Lab Results  Component Value Date   TRIG 160.0 (H) 03/20/2019   Lab Results  Component Value Date   CHOLHDL 3 03/20/2019   No results found for: HGBA1C    Assessment & Plan:   Problem List Items Addressed This Visit      Cardiovascular and Mediastinum   Essential hypertension - Primary   Relevant Orders   CBC (Completed)   Comprehensive metabolic panel (Completed)   Urinalysis, Routine w reflex microscopic (Completed)     Respiratory   Obstructive sleep apnea syndrome   Relevant Orders   Ambulatory referral to Sleep Studies     Musculoskeletal and Integument   Fibroma of bone     Other   Vitamin D deficiency   Relevant Orders   VITAMIN D 25 Hydroxy (Vit-D Deficiency, Fractures) (Completed)   Iron deficiency   Relevant Orders   Iron, TIBC and Ferritin Panel (Completed)   Microcytic anemia   Relevant Orders   CBC (Completed)   Iron, TIBC and Ferritin Panel (Completed)   Thiamine deficiency   Relevant Orders   Vitamin B1 (Completed)   B12 deficiency   Relevant Orders   Vitamin B12 (Completed)   Right knee pain   Relevant Orders   DG Knee Complete 4 Views Right (Completed)   Ambulatory referral to Sports Medicine   MR  Knee Right Wo Contrast (Completed)   Ambulatory referral to Orthopedic Surgery   Need for influenza vaccination   Relevant Orders   Flu Vaccine QUAD 6+ mos PF IM (Fluarix Quad PF) (Completed)  No orders of the defined types were placed in this encounter.   Follow-up: Return in about 6 months (around 06/15/2020).   Needs repeat xray of right femur in 6 mos.  Libby Maw, MD

## 2019-12-17 NOTE — Addendum Note (Signed)
Addended by: Lynnea Ferrier on: 12/17/2019 10:19 AM   Modules accepted: Orders

## 2019-12-18 DIAGNOSIS — M25561 Pain in right knee: Secondary | ICD-10-CM | POA: Diagnosis not present

## 2019-12-19 ENCOUNTER — Telehealth: Payer: Self-pay

## 2019-12-19 NOTE — Telephone Encounter (Signed)
Have ordered MRI of knee. Please let the patient know that radiologist suggested that we take a closer look at a spot(I try to avoid saying neoplasm at this point).

## 2019-12-19 NOTE — Addendum Note (Signed)
Addended by: Abelino Derrick A on: 12/19/2019 12:20 PM   Modules accepted: Orders

## 2019-12-19 NOTE — Telephone Encounter (Signed)
Radiology calling with report for xray of right knee. Per radiologist right knee irregular sclerotic density in distal right femur possible neoplasm recommending MRI of right knee please advise.

## 2019-12-19 NOTE — Telephone Encounter (Signed)
Called patient to inform of MRI no answer LMTCB

## 2019-12-19 NOTE — Telephone Encounter (Signed)
Patient aware that MRI ordered

## 2019-12-21 LAB — VITAMIN B1: Vitamin B1 (Thiamine): 16 nmol/L (ref 8–30)

## 2019-12-21 LAB — IRON,TIBC AND FERRITIN PANEL
%SAT: 29 % (calc) (ref 16–45)
Ferritin: 127 ng/mL (ref 16–232)
Iron: 91 ug/dL (ref 45–160)
TIBC: 312 mcg/dL (calc) (ref 250–450)

## 2019-12-27 DIAGNOSIS — M47816 Spondylosis without myelopathy or radiculopathy, lumbar region: Secondary | ICD-10-CM | POA: Diagnosis not present

## 2019-12-27 DIAGNOSIS — G894 Chronic pain syndrome: Secondary | ICD-10-CM | POA: Diagnosis not present

## 2019-12-27 DIAGNOSIS — M5416 Radiculopathy, lumbar region: Secondary | ICD-10-CM | POA: Diagnosis not present

## 2019-12-27 DIAGNOSIS — M47812 Spondylosis without myelopathy or radiculopathy, cervical region: Secondary | ICD-10-CM | POA: Diagnosis not present

## 2020-01-06 ENCOUNTER — Other Ambulatory Visit: Payer: Self-pay

## 2020-01-06 ENCOUNTER — Ambulatory Visit
Admission: RE | Admit: 2020-01-06 | Discharge: 2020-01-06 | Disposition: A | Discharge status: Home / Self Care | Payer: Federal, State, Local not specified - PPO | Source: Ambulatory Visit | Attending: Family Medicine | Admitting: Family Medicine

## 2020-01-06 DIAGNOSIS — M25561 Pain in right knee: Secondary | ICD-10-CM | POA: Diagnosis not present

## 2020-01-07 ENCOUNTER — Encounter: Payer: Self-pay | Admitting: Family Medicine

## 2020-01-07 NOTE — Telephone Encounter (Signed)
Spoke to patient on the phone, she states she tried giving them a copy of the report and was told that it would have to be faxed from [place] directly due to being her PCP.  Advised her that she would need to signa medical release either with them to fax or come by to sign one her then we could fax them the results. Or the other option is to contact the imaging place where MRI was done and have them send it to the pain clinic.  Patient is agreeable to this.  Dm/cma

## 2020-01-08 NOTE — Addendum Note (Signed)
Addended by: Jon Billings on: 01/08/2020 04:39 PM   Modules accepted: Orders

## 2020-01-10 DIAGNOSIS — M8480 Other disorders of continuity of bone, unspecified site: Secondary | ICD-10-CM | POA: Insufficient documentation

## 2020-01-16 ENCOUNTER — Encounter: Payer: Self-pay | Admitting: Neurology

## 2020-01-17 ENCOUNTER — Other Ambulatory Visit: Payer: Self-pay

## 2020-01-17 ENCOUNTER — Institutional Professional Consult (permissible substitution): Payer: Self-pay | Admitting: Neurology

## 2020-01-17 MED ORDER — PROMETHAZINE HCL 25 MG PO TABS: 25.0000 mg | ORAL_TABLET | Freq: Four times a day (QID) | ORAL | 5 refills | Status: DC | PRN

## 2020-01-24 DIAGNOSIS — G894 Chronic pain syndrome: Secondary | ICD-10-CM | POA: Diagnosis not present

## 2020-01-24 DIAGNOSIS — M47816 Spondylosis without myelopathy or radiculopathy, lumbar region: Secondary | ICD-10-CM | POA: Diagnosis not present

## 2020-01-24 DIAGNOSIS — M5416 Radiculopathy, lumbar region: Secondary | ICD-10-CM | POA: Diagnosis not present

## 2020-01-24 DIAGNOSIS — M47812 Spondylosis without myelopathy or radiculopathy, cervical region: Secondary | ICD-10-CM | POA: Diagnosis not present

## 2020-01-30 DIAGNOSIS — M79659 Pain in unspecified thigh: Secondary | ICD-10-CM | POA: Diagnosis not present

## 2020-01-30 DIAGNOSIS — M25561 Pain in right knee: Secondary | ICD-10-CM | POA: Diagnosis not present

## 2020-01-30 DIAGNOSIS — M79651 Pain in right thigh: Secondary | ICD-10-CM | POA: Diagnosis not present

## 2020-01-30 DIAGNOSIS — M79604 Pain in right leg: Secondary | ICD-10-CM | POA: Diagnosis not present

## 2020-02-04 MED ORDER — LINZESS 290 MCG PO CAPS: 290.0000 ug | ORAL_CAPSULE | Freq: Every day | ORAL | 0 refills | Status: DC

## 2020-02-20 ENCOUNTER — Encounter: Payer: Self-pay | Admitting: Family Medicine

## 2020-02-20 ENCOUNTER — Other Ambulatory Visit: Payer: Self-pay

## 2020-02-20 DIAGNOSIS — M79651 Pain in right thigh: Secondary | ICD-10-CM | POA: Diagnosis not present

## 2020-02-20 DIAGNOSIS — I10 Essential (primary) hypertension: Secondary | ICD-10-CM

## 2020-02-20 MED ORDER — AMLODIPINE-OLMESARTAN 10-20 MG PO TABS: 1.0000 | ORAL_TABLET | Freq: Every day | ORAL | 1 refills | Status: DC

## 2020-02-26 DIAGNOSIS — M1711 Unilateral primary osteoarthritis, right knee: Secondary | ICD-10-CM | POA: Diagnosis not present

## 2020-02-26 DIAGNOSIS — M79651 Pain in right thigh: Secondary | ICD-10-CM | POA: Diagnosis not present

## 2020-02-26 DIAGNOSIS — M170 Bilateral primary osteoarthritis of knee: Secondary | ICD-10-CM | POA: Diagnosis not present

## 2020-03-06 DIAGNOSIS — M5416 Radiculopathy, lumbar region: Secondary | ICD-10-CM | POA: Diagnosis not present

## 2020-03-06 DIAGNOSIS — G894 Chronic pain syndrome: Secondary | ICD-10-CM | POA: Diagnosis not present

## 2020-03-06 DIAGNOSIS — M47812 Spondylosis without myelopathy or radiculopathy, cervical region: Secondary | ICD-10-CM | POA: Diagnosis not present

## 2020-03-06 DIAGNOSIS — M47816 Spondylosis without myelopathy or radiculopathy, lumbar region: Secondary | ICD-10-CM | POA: Diagnosis not present

## 2020-03-24 DIAGNOSIS — M5416 Radiculopathy, lumbar region: Secondary | ICD-10-CM | POA: Diagnosis not present

## 2020-04-16 DIAGNOSIS — M47816 Spondylosis without myelopathy or radiculopathy, lumbar region: Secondary | ICD-10-CM | POA: Diagnosis not present

## 2020-04-16 DIAGNOSIS — M47812 Spondylosis without myelopathy or radiculopathy, cervical region: Secondary | ICD-10-CM | POA: Diagnosis not present

## 2020-04-16 DIAGNOSIS — G894 Chronic pain syndrome: Secondary | ICD-10-CM | POA: Diagnosis not present

## 2020-04-16 DIAGNOSIS — M5416 Radiculopathy, lumbar region: Secondary | ICD-10-CM | POA: Diagnosis not present

## 2020-04-22 ENCOUNTER — Other Ambulatory Visit (HOSPITAL_BASED_OUTPATIENT_CLINIC_OR_DEPARTMENT_OTHER): Payer: Self-pay

## 2020-04-22 DIAGNOSIS — G4733 Obstructive sleep apnea (adult) (pediatric): Secondary | ICD-10-CM

## 2020-04-29 ENCOUNTER — Other Ambulatory Visit: Payer: Self-pay

## 2020-04-29 MED ORDER — LINZESS 290 MCG PO CAPS: 290.0000 ug | ORAL_CAPSULE | Freq: Every day | ORAL | 0 refills | Status: DC

## 2020-05-07 DIAGNOSIS — M2241 Chondromalacia patellae, right knee: Secondary | ICD-10-CM | POA: Diagnosis not present

## 2020-05-07 DIAGNOSIS — S83231D Complex tear of medial meniscus, current injury, right knee, subsequent encounter: Secondary | ICD-10-CM | POA: Diagnosis not present

## 2020-05-16 ENCOUNTER — Other Ambulatory Visit: Payer: Self-pay

## 2020-05-16 ENCOUNTER — Ambulatory Visit (HOSPITAL_BASED_OUTPATIENT_CLINIC_OR_DEPARTMENT_OTHER)
Payer: Federal, State, Local not specified - PPO | Attending: Physical Medicine and Rehabilitation | Admitting: Internal Medicine

## 2020-05-16 DIAGNOSIS — G4733 Obstructive sleep apnea (adult) (pediatric): Secondary | ICD-10-CM | POA: Insufficient documentation

## 2020-05-23 ENCOUNTER — Other Ambulatory Visit: Payer: Self-pay

## 2020-05-23 MED ORDER — LINZESS 290 MCG PO CAPS: 290.0000 ug | ORAL_CAPSULE | Freq: Every day | ORAL | 0 refills | Status: DC

## 2020-05-24 NOTE — Procedures (Signed)
    Patient Name: Tiffany Martin, Tiffany Martin Date: 05/17/2020 Gender: Female D.O.B: Jul 13, 1960 Age (years): 27 Referring Provider: Margaretha Sheffield Height (inches): 39 Interpreting Physician: Baird Lyons MD, ABSM Weight (lbs): 210 RPSGT: Jacolyn Reedy BMI: 33 MRN: [id number] Neck Size: 15.25  CLINICAL INFORMATION Sleep Study Type: HST Indication for sleep study: OSA Epworth Sleepiness Score: 9  SLEEP STUDY TECHNIQUE A multi-channel overnight portable sleep study was performed. The channels recorded were: nasal airflow, thoracic respiratory movement, and oxygen saturation with a pulse oximetry. Snoring was also monitored.  MEDICATIONS Patient self administered medications include: none reported.  SLEEP ARCHITECTURE Patient was studied for 476 minutes. The sleep efficiency was 100.0 % and the patient was supine for 95.2%. The arousal index was 0.0 per hour.  RESPIRATORY PARAMETERS The overall AHI was 16.6 per hour, with a central apnea index of 0.0 per hour. The oxygen nadir was 64% during sleep.  CARDIAC DATA Mean heart rate during sleep was 82.7 bpm.  IMPRESSIONS - Moderate obstructive sleep apnea occurred during this study (AHI = 16.6/h). - No significant central sleep apnea occurred during this study (CAI = 0.0/h). - Oxygen desaturation was noted during this study (Min O2 = 64%). Mean O2 saturation 88% with time less than or equal to 89% 246 minutes. - Patient snored.  DIAGNOSIS - Obstructive Sleep Apnea (G47.33) - Nocturnal Hypoxemia (G47.36)  RECOMMENDATIONS - Suggest CPAP titration in-center study to meet insurance documentation requirements if supplemental O2 is needed in addition to CPAP. - Be careful with alcohol, sedatives and other CNS depressants that may worsen sleep apnea and disrupt normal sleep architecture. - Sleep hygiene should be reviewed to assess factors that may improve sleep quality. - Weight management and regular exercise should be initiated or  continued.  [Electronically signed] 05/24/2020 11:08 AM  Baird Lyons MD, ABSM Diplomate, American Board of Sleep Medicine   NPI: [id number]                        [place], [place] [facility]  ELECTRONICALLY SIGNED ON:  05/24/2020, 11:04 AM [place] PH: [phone]   FX: [phone] [place]

## 2020-05-25 ENCOUNTER — Encounter: Payer: Self-pay | Admitting: Family Medicine

## 2020-05-29 DIAGNOSIS — M25561 Pain in right knee: Secondary | ICD-10-CM | POA: Diagnosis not present

## 2020-06-03 DIAGNOSIS — M25561 Pain in right knee: Secondary | ICD-10-CM | POA: Diagnosis not present

## 2020-06-05 DIAGNOSIS — M25561 Pain in right knee: Secondary | ICD-10-CM | POA: Diagnosis not present

## 2020-06-10 DIAGNOSIS — M25561 Pain in right knee: Secondary | ICD-10-CM | POA: Diagnosis not present

## 2020-06-12 DIAGNOSIS — M5416 Radiculopathy, lumbar region: Secondary | ICD-10-CM | POA: Diagnosis not present

## 2020-06-12 DIAGNOSIS — G894 Chronic pain syndrome: Secondary | ICD-10-CM | POA: Diagnosis not present

## 2020-06-12 DIAGNOSIS — M47816 Spondylosis without myelopathy or radiculopathy, lumbar region: Secondary | ICD-10-CM | POA: Diagnosis not present

## 2020-06-12 DIAGNOSIS — M47812 Spondylosis without myelopathy or radiculopathy, cervical region: Secondary | ICD-10-CM | POA: Diagnosis not present

## 2020-06-16 ENCOUNTER — Ambulatory Visit: Payer: Federal, State, Local not specified - PPO | Admitting: Family Medicine

## 2020-06-16 ENCOUNTER — Other Ambulatory Visit: Payer: Self-pay

## 2020-06-16 ENCOUNTER — Encounter: Payer: Self-pay | Admitting: Family Medicine

## 2020-06-16 VITALS — BP 128/74 | HR 87 | Temp 97.7°F | Ht 66.0 in | Wt 215.8 lb

## 2020-06-16 DIAGNOSIS — D561 Beta thalassemia: Secondary | ICD-10-CM

## 2020-06-16 DIAGNOSIS — E538 Deficiency of other specified B group vitamins: Secondary | ICD-10-CM

## 2020-06-16 DIAGNOSIS — D509 Iron deficiency anemia, unspecified: Secondary | ICD-10-CM

## 2020-06-16 DIAGNOSIS — E559 Vitamin D deficiency, unspecified: Secondary | ICD-10-CM | POA: Diagnosis not present

## 2020-06-16 DIAGNOSIS — E611 Iron deficiency: Secondary | ICD-10-CM

## 2020-06-16 DIAGNOSIS — G4733 Obstructive sleep apnea (adult) (pediatric): Secondary | ICD-10-CM

## 2020-06-16 DIAGNOSIS — I10 Essential (primary) hypertension: Secondary | ICD-10-CM

## 2020-06-16 LAB — BASIC METABOLIC PANEL
BUN: 11 mg/dL (ref 6–23)
CO2: 25 mEq/L (ref 19–32)
Calcium: 9.8 mg/dL (ref 8.4–10.5)
Chloride: 104 mEq/L (ref 96–112)
Creatinine, Ser: 0.77 mg/dL (ref 0.40–1.20)
GFR: 83.31 mL/min (ref >60.00)
Glucose, Bld: 110 mg/dL — ABNORMAL HIGH (ref 70–99)
Potassium: 3.8 mEq/L (ref 3.5–5.1)
Sodium: 143 mEq/L (ref 135–145)

## 2020-06-16 LAB — CBC
HCT: 36.3 % (ref 36.0–46.0)
Hemoglobin: 11.9 g/dL — ABNORMAL LOW (ref 12.0–15.0)
MCHC: 32.7 g/dL (ref 30.0–36.0)
MCV: 71.6 fl — ABNORMAL LOW (ref 78.0–100.0)
Platelets: 388 10*3/uL (ref 150.0–400.0)
RBC: 5.07 Mil/uL (ref 3.87–5.11)
RDW: 17.2 % — ABNORMAL HIGH (ref 11.5–15.5)
WBC: 7.1 10*3/uL (ref 4.0–10.5)

## 2020-06-16 LAB — VITAMIN B12: Vitamin B-12: 657 pg/mL (ref 211–911)

## 2020-06-16 LAB — VITAMIN D 25 HYDROXY (VIT D DEFICIENCY, FRACTURES): VITD: 48.13 ng/mL (ref 30.00–100.00)

## 2020-06-16 MED ORDER — CARVEDILOL 25 MG PO TABS: 25.0000 mg | ORAL_TABLET | Freq: Two times a day (BID) | ORAL | 2 refills | Status: DC

## 2020-06-16 MED ORDER — AMLODIPINE-OLMESARTAN 10-20 MG PO TABS: 1.0000 | ORAL_TABLET | Freq: Every day | ORAL | 1 refills | Status: DC

## 2020-06-16 NOTE — Progress Notes (Signed)
Established Patient Office Visit  Subjective:  Patient ID: Tiffany Martin, female    DOB: March 17, 1961  Age: 60 y.o. MRN: [id number]  CC:  Chief Complaint  Patient presents with  . Follow-up    6 month follow up on BP, no concerns.    HPI Tiffany Martin presents for htn, microcytic anemia, and vit d def. Sp TAH in 80s for endometriosis. Has had covid series. Bp well controlled. Some swelling in [place] with azor. Sp sleep study. Positive  For apnea. Will have cpap machine.   Past Medical History:  Diagnosis Date  . Abdominal hernia   . Beta thalassemia ([place])   . GERD (gastroesophageal reflux disease)   . High blood pressure   . Irritable bowel syndrome   . Ischemic colitis ([place])   . Lactose intolerance     Past Surgical History:  Procedure Laterality Date  . ABDOMINAL HYSTERECTOMY    . APPENDECTOMY    . CESAREAN SECTION    . COLONOSCOPY    . KNEE SURGERY Right    x 3  . KNEE SURGERY Left    x 2  . ROTATOR CUFF REPAIR Left   . SHOULDER SURGERY Bilateral    for thoracic outlet syndrome  . TONSILLECTOMY    . UPPER GASTROINTESTINAL ENDOSCOPY      Family History  Problem Relation Age of Onset  . Cirrhosis Father        alcohol related  . Liver disease Sister        liver failure  . Colon cancer Neg Hx   . Esophageal cancer Neg Hx   . Stomach cancer Neg Hx   . Pancreatic cancer Neg Hx   . Rectal cancer Neg Hx     Social History   Socioeconomic History  . Marital status: Single    Spouse name: Not on file  . Number of children: Not on file  . Years of education: Not on file  . Highest education level: Not on file  Occupational History  . Not on file  Tobacco Use  . Smoking status: Never Smoker  . Smokeless tobacco: Never Used  Vaping Use  . Vaping Use: Never used  Substance and Sexual Activity  . Alcohol use: Never  . Drug use: Never  . Sexual activity: Not on file  Other Topics Concern  . Not on file  Social History Narrative  . Not on file    Social Determinants of Health   Financial Resource Strain: Not on file  Food Insecurity: Not on file  Transportation Needs: Not on file  Physical Activity: Not on file  Stress: Not on file  Social Connections: Not on file  Intimate Partner Violence: Not on file    Outpatient Medications Prior to Visit  Medication Sig Dispense Refill  . acetaminophen (TYLENOL) 500 MG tablet Take 1,000 mg by mouth every 8 (eight) hours as needed.    [name] aspirin 81 MG chewable tablet Chew 1 tablet by mouth daily.    . B Complex-Biotin-FA (B-100 COMPLEX PO) Take 1 capsule by mouth daily.    . Cholecalciferol (VITAMIN D3) 25 MCG (1000 UT) CAPS Take 1 capsule by mouth daily.    [name] DIGESTIVE ENZYMES PO Take 1 capsule by mouth 2 (two) times daily with a meal.    . DULoxetine (CYMBALTA) 60 MG capsule Take 1 capsule (60 mg total) by mouth daily. (Patient taking differently: Take 60 mg by mouth daily. Patient taking 90 mg daily) 90 capsule 1  .  ferrous sulfate 325 (65 FE) MG tablet Take 1 tablet by mouth daily.    . fexofenadine (ALLEGRA) 180 MG tablet Take 1 tablet (180 mg total) by mouth daily. 90 tablet 3  . fluticasone (FLONASE) 50 MCG/ACT nasal spray Place into the nose.    . gabapentin (NEURONTIN) 600 MG tablet Take 1 tablet (600 mg total) by mouth 3 (three) times daily. 270 tablet 1  . LINZESS 290 MCG CAPS capsule Take 1 capsule (290 mcg total) by mouth daily. You will be due for an office visit in April. Please call to schedule an appointment in March.  Thank you 90 capsule 0  . nitroGLYCERIN (NITROSTAT) 0.4 MG SL tablet Place under the tongue as needed.    . Omeprazole 20 MG TBEC Take 1 tablet by mouth daily.    . ondansetron (ZOFRAN) 4 MG tablet Take 1 tablet (4 mg total) by mouth every 8 (eight) hours as needed. 20 tablet 2  . SUMAtriptan (IMITREX) 50 MG tablet Take 1 tablet (50 mg total) by mouth every 2 (two) hours as needed for migraine. May repeat once in 2 hours if headache persists or recurs. 10  tablet 2  . tiZANidine (ZANAFLEX) 4 MG tablet Take 4 mg by mouth 3 (three) times daily as needed.    . traMADol (ULTRAM) 50 MG tablet Take 2 tablets (100 mg total) by mouth every 8 (eight) hours as needed. 90 tablet 0  . vitamin C (ASCORBIC ACID) 500 MG tablet Take 500 mg by mouth daily.    [name] amlodipine-olmesartan (AZOR) 10-20 MG tablet Take 1 tablet by mouth daily. 90 tablet 1  . carvedilol (COREG) 25 MG tablet Take 1 tablet (25 mg total) by mouth 2 (two) times daily. 180 tablet 2  . benzonatate (TESSALON) 200 MG capsule Take 200 mg by mouth 3 (three) times daily. (Patient not taking: Reported on 06/16/2020)    . cyclobenzaprine (FLEXERIL) 10 MG tablet Take 10 mg by mouth 3 (three) times daily. (Patient not taking: Reported on 12/17/2019)    . diclofenac Sodium (VOLTAREN) 1 % GEL Apply 4 g topically 4 (four) times daily. (Patient not taking: Reported on 06/16/2020)    . meloxicam (MOBIC) 15 MG tablet Take 15 mg by mouth daily.    . promethazine (PHENERGAN) 25 MG tablet Take 1 tablet (25 mg total) by mouth every 6 (six) hours as needed for nausea or vomiting. 30 tablet 5  . Thiamine HCl (VITAMIN B-1) 250 MG tablet Take 250 mg by mouth daily. (Patient not taking: Reported on 06/16/2020)     No facility-administered medications prior to visit.    Allergies  Allergen Reactions  . Pork-Derived Products Anaphylaxis  . Codeine Nausea And Vomiting    documented per [facility] paper chart/ EMR   . Morphine Nausea And Vomiting  . Penicillins Hives and Rash  . Aspirin   . Baclofen   . Gluten Meal Cough, Diarrhea, Nausea And Vomiting and Swelling  . Nsaids   . Latex Rash    ROS Review of Systems  Constitutional: Negative.   HENT: Negative.   Eyes: Negative for photophobia and visual disturbance.  Respiratory: Negative.   Cardiovascular: Negative.   Gastrointestinal: Negative.   Endocrine: Negative for polyphagia and polyuria.  Genitourinary: Negative.   Musculoskeletal: Negative for back pain and  gait problem.  Allergic/Immunologic: Negative for immunocompromised state.  Neurological: Negative for light-headedness and numbness.  Hematological: Does not bruise/bleed easily.  Psychiatric/Behavioral: Negative.       Objective:  Physical Exam Vitals and nursing note reviewed.  Constitutional:      General: She is not in acute distress.    Appearance: Normal appearance. She is not ill-appearing, toxic-appearing or diaphoretic.  HENT:     Head: Normocephalic and atraumatic.     Right Ear: Tympanic membrane, ear canal and external ear normal.     Left Ear: Tympanic membrane, ear canal and external ear normal.     Mouth/Throat:     Mouth: Mucous membranes are moist.  Eyes:     General: No scleral icterus.    Extraocular Movements: Extraocular movements intact.     Conjunctiva/sclera: Conjunctivae normal.     Pupils: Pupils are equal, round, and reactive to light.  Neck:     Vascular: No carotid bruit.  Cardiovascular:     Rate and Rhythm: Normal rate and regular rhythm.  Pulmonary:     Effort: Pulmonary effort is normal.     Breath sounds: Normal breath sounds.  Abdominal:     General: Bowel sounds are normal.  Musculoskeletal:     Cervical back: Neck supple. No rigidity or tenderness.  Lymphadenopathy:     Cervical: No cervical adenopathy.  Neurological:     Mental Status: She is alert and oriented to person, place, and time.  Psychiatric:        Mood and Affect: Mood normal.        Behavior: Behavior normal.     BP 128/74   Pulse 87   Temp 97.7 F (36.5 C) (Temporal)   Ht 5\' 6"  (1.676 m)   Wt 215 lb 12.8 oz (97.9 kg)   SpO2 94%   BMI 34.83 kg/m  Wt Readings from Last 3 Encounters:  06/16/20 215 lb 12.8 oz (97.9 kg)  12/17/19 212 lb 6.4 oz (96.3 kg)  09/13/19 217 lb 6.4 oz (98.6 kg)     Health Maintenance Due  Topic Date Due  . Hepatitis C Screening  Never done  . HIV Screening  Never done  . TETANUS/TDAP  Never done  . COVID-19 Vaccine (3 -  Booster for Pfizer series) 02/23/2020    There are no preventive care reminders to display for this patient.  Lab Results  Component Value Date   TSH 1.20 03/05/2019   Lab Results  Component Value Date   WBC 7.6 12/17/2019   HGB 11.6 (L) 12/17/2019   HCT 36.6 12/17/2019   MCV 71.3 (L) 12/17/2019   PLT 374.0 12/17/2019   Lab Results  Component Value Date   NA 142 12/17/2019   K 4.4 12/17/2019   CO2 30 12/17/2019   GLUCOSE 115 (H) 12/17/2019   BUN 10 12/17/2019   CREATININE 0.92 12/17/2019   BILITOT 0.5 12/17/2019   ALKPHOS 83 12/17/2019   AST 16 12/17/2019   ALT 16 12/17/2019   PROT 7.6 12/17/2019   ALBUMIN 4.7 12/17/2019   CALCIUM 10.1 12/17/2019   GFR 75.14 12/17/2019   Lab Results  Component Value Date   CHOL 157 03/20/2019   Lab Results  Component Value Date   HDL 47.70 03/20/2019   Lab Results  Component Value Date   LDLCALC 78 03/20/2019   Lab Results  Component Value Date   TRIG 160.0 (H) 03/20/2019   Lab Results  Component Value Date   CHOLHDL 3 03/20/2019   No results found for: HGBA1C    Assessment & Plan:   Problem List Items Addressed This Visit      Cardiovascular and Mediastinum  Essential hypertension - Primary   Relevant Medications   amlodipine-olmesartan (AZOR) 10-20 MG tablet   carvedilol (COREG) 25 MG tablet   Other Relevant Orders   Basic metabolic panel     Respiratory   Obstructive sleep apnea syndrome     Other   Vitamin D deficiency   Relevant Orders   VITAMIN D 25 Hydroxy (Vit-D Deficiency, Fractures)   Iron deficiency   Relevant Orders   Iron, TIBC and Ferritin Panel   Microcytic anemia   Relevant Orders   CBC   Iron, TIBC and Ferritin Panel   B12 deficiency   Relevant Orders   Vitamin B12   Beta thalassemia ([facility])   Relevant Orders   CBC      Meds ordered this encounter  Medications  . amlodipine-olmesartan (AZOR) 10-20 MG tablet    Sig: Take 1 tablet by mouth daily.    Dispense:  90 tablet     Refill:  1  . carvedilol (COREG) 25 MG tablet    Sig: Take 1 tablet (25 mg total) by mouth 2 (two) times daily.    Dispense:  180 tablet    Refill:  2    Follow-up: Return in about 6 months (around 12/17/2020).  Continue all meds including b complex and iron.   Libby Maw, MD

## 2020-06-17 DIAGNOSIS — M25561 Pain in right knee: Secondary | ICD-10-CM | POA: Diagnosis not present

## 2020-06-17 LAB — IRON,TIBC AND FERRITIN PANEL
%SAT: 27 % (calc) (ref 16–45)
Ferritin: 127 ng/mL (ref 16–288)
Iron: 86 ug/dL (ref 45–160)
TIBC: 322 mcg/dL (calc) (ref 250–450)

## 2020-06-18 ENCOUNTER — Encounter: Payer: Self-pay | Admitting: Family Medicine

## 2020-06-19 ENCOUNTER — Encounter: Payer: Self-pay | Admitting: Pulmonary Disease

## 2020-06-19 ENCOUNTER — Ambulatory Visit: Payer: Federal, State, Local not specified - PPO | Admitting: Pulmonary Disease

## 2020-06-19 ENCOUNTER — Other Ambulatory Visit: Payer: Self-pay

## 2020-06-19 VITALS — BP 122/72 | HR 81 | Temp 97.3°F | Ht 66.0 in | Wt 215.0 lb

## 2020-06-19 DIAGNOSIS — G4733 Obstructive sleep apnea (adult) (pediatric): Secondary | ICD-10-CM | POA: Diagnosis not present

## 2020-06-19 NOTE — Patient Instructions (Signed)
Moderate probability of significant obstructive sleep apnea  We will schedule you for home sleep study Update your results once reviewed Treatment options as we discussed  We will see you back in about 3 to 4 months  Call with significant concerns  Encourage weight loss efforts Sleep Apnea Sleep apnea affects breathing during sleep. It causes breathing to stop for a short time or to become shallow. It can also increase the risk of:  Heart attack.  Stroke.  Being very overweight (obese).  Diabetes.  Heart failure.  Irregular heartbeat. The goal of treatment is to help you breathe normally again. What are the causes? There are three kinds of sleep apnea:  Obstructive sleep apnea. This is caused by a blocked or collapsed airway.  Central sleep apnea. This happens when the brain does not send the right signals to the muscles that control breathing.  Mixed sleep apnea. This is a combination of obstructive and central sleep apnea. The most common cause of this condition is a collapsed or blocked airway. This can happen if:  Your throat muscles are too relaxed.  Your tongue and tonsils are too large.  You are overweight.  Your airway is too small.   What increases the risk?  Being overweight.  Smoking.  Having a small airway.  Being older.  Being female.  Drinking alcohol.  Taking medicines to calm yourself (sedatives or tranquilizers).  Having family members with the condition. What are the signs or symptoms?  Trouble staying asleep.  Being sleepy or tired during the day.  Getting angry a lot.  Loud snoring.  Headaches in the morning.  Not being able to focus your mind (concentrate).  Forgetting things.  Less interest in sex.  Mood swings.  Personality changes.  Feelings of sadness (depression).  Waking up a lot during the night to pee (urinate).  Dry mouth.  Sore throat. How is this diagnosed?  Your medical history.  A physical  exam.  A test that is done when you are sleeping (sleep study). The test is most often done in a sleep lab but may also be done at home. How is this treated?  Sleeping on your side.  Using a medicine to get rid of mucus in your nose (decongestant).  Avoiding the use of alcohol, medicines to help you relax, or certain pain medicines (narcotics).  Losing weight, if needed.  Changing your diet.  Not smoking.  Using a machine to open your airway while you sleep, such as: ? An oral appliance. This is a mouthpiece that shifts your lower jaw forward. ? A CPAP device. This device blows air through a mask when you breathe out (exhale). ? An EPAP device. This has valves that you put in each nostril. ? A BPAP device. This device blows air through a mask when you breathe in (inhale) and breathe out.  Having surgery if other treatments do not work. It is important to get treatment for sleep apnea. Without treatment, it can lead to:  High blood pressure.  Coronary artery disease.  In men, not being able to have an erection (impotence).  Reduced thinking ability.   Follow these instructions at home: Lifestyle  Make changes that your doctor recommends.  Eat a healthy diet.  Lose weight if needed.  Avoid alcohol, medicines to help you relax, and some pain medicines.  Do not use any products that contain nicotine or tobacco, such as cigarettes, e-cigarettes, and chewing tobacco. If you need help quitting, ask your doctor.  General instructions  Take over-the-counter and prescription medicines only as told by your doctor.  If you were given a machine to use while you sleep, use it only as told by your doctor.  If you are having surgery, make sure to tell your doctor you have sleep apnea. You may need to bring your device with you.  Keep all follow-up visits as told by your doctor. This is important. Contact a doctor if:  The machine that you were given to use during sleep bothers  you or does not seem to be working.  You do not get better.  You get worse. Get help right away if:  Your chest hurts.  You have trouble breathing in enough air.  You have an uncomfortable feeling in your back, arms, or stomach.  You have trouble talking.  One side of your body feels weak.  A part of your face is hanging down. These symptoms may be an emergency. Do not wait to see if the symptoms will go away. Get medical help right away. Call your local emergency services (911 in the [place] Do not drive yourself to the hospital. Summary  This condition affects breathing during sleep.  The most common cause is a collapsed or blocked airway.  The goal of treatment is to help you breathe normally while you sleep. This information is not intended to replace advice given to you by your health care provider. Make sure you discuss any questions you have with your health care provider. Document Revised: 01/06/2018 Document Reviewed: 11/15/2017 Elsevier Patient Education  [place].

## 2020-06-19 NOTE — Progress Notes (Signed)
Tiffany Martin    [id number]    [birth year]  Primary Care Physician:Kremer, Mortimer Fries, MD  Referring Physician: Margaretha Sheffield, MD [address] [place],  [place] [address]  Chief complaint:   Patient with a past history of obstructive sleep apnea In for evaluation secondary to concerns of persistent sleep apnea  HPI:  Diagnosed with obstructive sleep apnea over 10 years ago She stopped using CPAP about 3 years ago, she said she was feeling better at the time  Relocated In for evaluation today secondary to concerns of persistent sleep apnea that is untreated  History of hypertension, chronic back pain for which he is on opiates She does have nasal stuffiness and congestion, allergies  Usually goes to bed between 10 and 11 PM Takes about an hour to fall asleep 203 awakenings Final wake up time between 4 and 5 AM  Weight is up about 15 to 20 pounds recently  Admits to dryness of the mouth in the morning Occasional headaches She does have a stuffy nose when she wakes up in the morning She has siblings on CPAP Memory is poor Admits to daytime sleepiness  No difficulty driving for 2 to 3 hours  Never smoker No alcohol use  Has been on opiate medications for back pain for over 10 years  Outpatient Encounter Medications as of 06/19/2020  Medication Sig  . acetaminophen (TYLENOL) 500 MG tablet Take 1,000 mg by mouth every 8 (eight) hours as needed.  [name] amlodipine-olmesartan (AZOR) 10-20 MG tablet Take 1 tablet by mouth daily.  [name] aspirin 81 MG chewable tablet Chew 1 tablet by mouth daily.  . B Complex-Biotin-FA (B-100 COMPLEX PO) Take 1 capsule by mouth daily.  . benzonatate (TESSALON) 200 MG capsule Take 200 mg by mouth 3 (three) times daily.  . carvedilol (COREG) 25 MG tablet Take 1 tablet (25 mg total) by mouth 2 (two) times daily.  . Cholecalciferol (VITAMIN D3) 25 MCG (1000 UT) CAPS Take 1 capsule by mouth daily.  . diclofenac Sodium  (VOLTAREN) 1 % GEL Apply 4 g topically 4 (four) times daily.  [name] DIGESTIVE ENZYMES PO Take 1 capsule by mouth 2 (two) times daily with a meal.  . DULoxetine (CYMBALTA) 60 MG capsule Take 1 capsule (60 mg total) by mouth daily. (Patient taking differently: Take 60 mg by mouth daily. Patient taking 90 mg daily)  . ferrous sulfate 325 (65 FE) MG tablet Take 1 tablet by mouth daily.  . fexofenadine (ALLEGRA) 180 MG tablet Take 1 tablet (180 mg total) by mouth daily.  . fluticasone (FLONASE) 50 MCG/ACT nasal spray Place into the nose.  . gabapentin (NEURONTIN) 600 MG tablet Take 1 tablet (600 mg total) by mouth 3 (three) times daily.  [name] LINZESS 290 MCG CAPS capsule Take 1 capsule (290 mcg total) by mouth daily. You will be due for an office visit in April. Please call to schedule an appointment in March.  Thank you  . nitroGLYCERIN (NITROSTAT) 0.4 MG SL tablet Place under the tongue as needed.  . Omeprazole 20 MG TBEC Take 1 tablet by mouth daily.  . ondansetron (ZOFRAN) 4 MG tablet Take 1 tablet (4 mg total) by mouth every 8 (eight) hours as needed.  . SUMAtriptan (IMITREX) 50 MG tablet Take 1 tablet (50 mg total) by mouth every 2 (two) hours as needed for migraine. May repeat once in 2 hours if headache persists or recurs.  [name] tiZANidine (ZANAFLEX) 4  MG tablet Take 4 mg by mouth 3 (three) times daily as needed.  . traMADol (ULTRAM) 50 MG tablet Take 2 tablets (100 mg total) by mouth every 8 (eight) hours as needed.  . vitamin C (ASCORBIC ACID) 500 MG tablet Take 500 mg by mouth daily.  . promethazine (PHENERGAN) 25 MG tablet Take 1 tablet (25 mg total) by mouth every 6 (six) hours as needed for nausea or vomiting.  . [DISCONTINUED] cyclobenzaprine (FLEXERIL) 10 MG tablet Take 10 mg by mouth 3 (three) times daily. (Patient not taking: Reported on 12/17/2019)   No facility-administered encounter medications on file as of 06/19/2020.    Allergies as of 06/19/2020 - Review Complete 06/19/2020  Allergen  Reaction Noted  . Pork-derived products Anaphylaxis 03/13/2012  . Codeine Nausea And Vomiting 01/31/2012  . Morphine Nausea And Vomiting 02/26/2019  . Penicillins Hives and Rash 01/14/2012  . Aspirin  04/09/2013  . Baclofen  07/20/2019  . Gluten meal Cough, Diarrhea, Nausea And Vomiting, and Swelling 04/07/2018  . Nsaids  02/26/2019  . Latex Rash 12/14/2013    Past Medical History:  Diagnosis Date  . Abdominal hernia   . Beta thalassemia ([place])   . GERD (gastroesophageal reflux disease)   . High blood pressure   . Irritable bowel syndrome   . Ischemic colitis ([place])   . Lactose intolerance     Past Surgical History:  Procedure Laterality Date  . ABDOMINAL HYSTERECTOMY    . APPENDECTOMY    . CESAREAN SECTION    . COLONOSCOPY    . KNEE SURGERY Right    x 3  . KNEE SURGERY Left    x 2  . ROTATOR CUFF REPAIR Left   . SHOULDER SURGERY Bilateral    for thoracic outlet syndrome  . TONSILLECTOMY    . UPPER GASTROINTESTINAL ENDOSCOPY      Family History  Problem Relation Age of Onset  . Cirrhosis Father        alcohol related  . Liver disease Sister        liver failure  . Colon cancer Neg Hx   . Esophageal cancer Neg Hx   . Stomach cancer Neg Hx   . Pancreatic cancer Neg Hx   . Rectal cancer Neg Hx     Social History   Socioeconomic History  . Marital status: Single    Spouse name: Not on file  . Number of children: Not on file  . Years of education: Not on file  . Highest education level: Not on file  Occupational History  . Not on file  Tobacco Use  . Smoking status: Never Smoker  . Smokeless tobacco: Never Used  Vaping Use  . Vaping Use: Never used  Substance and Sexual Activity  . Alcohol use: Never  . Drug use: Never  . Sexual activity: Not on file  Other Topics Concern  . Not on file  Social History Narrative  . Not on file   Social Determinants of Health   Financial Resource Strain: Not on file  Food Insecurity: Not on file  Transportation  Needs: Not on file  Physical Activity: Not on file  Stress: Not on file  Social Connections: Not on file  Intimate Partner Violence: Not on file    Review of Systems  Respiratory: Positive for apnea.   Psychiatric/Behavioral: Positive for sleep disturbance.    There were no vitals filed for this visit.   Physical Exam Constitutional:      Appearance: She is  obese.  HENT:     Nose: Nose normal. No congestion or rhinorrhea.     Mouth/Throat:     Comments: Mallampati 3, macroglossia, crowded oropharynx Eyes:     General:        Right eye: No discharge.     Pupils: Pupils are equal, round, and reactive to light.  Cardiovascular:     Rate and Rhythm: Normal rate and regular rhythm.     Heart sounds: No murmur heard.   Pulmonary:     Effort: No respiratory distress.     Breath sounds: No stridor. No wheezing or rhonchi.  Musculoskeletal:     Cervical back: No rigidity or tenderness.  Neurological:     Mental Status: She is alert.  Psychiatric:        Mood and Affect: Mood normal.    Epworth Sleepiness Scale of 12  Data Reviewed: Previous study not available Referral notes from primary doctor reviewed Assessment:  Moderate probability of significant obstructive sleep apnea  Obesity, being on opiates places her at increased risk of significant obstructive sleep apnea  Excessive daytime sleepiness  Obesity  Pathophysiology of sleep disordered breathing reviewed with the patient Treatment options reviewed with the patient  Plan/Recommendations: We will schedule patient for home sleep study  We will update with results as soon as reviewed  Encouraged weight loss efforts  Tentative follow-up in 3 to 4 months  Encouraged to call with any significant concerns   Sherrilyn Rist MD [facility] 06/19/2020, 9:27 AM  CC: Margaretha Sheffield, MD

## 2020-07-22 ENCOUNTER — Ambulatory Visit: Payer: Federal, State, Local not specified - PPO | Admitting: Gastroenterology

## 2020-07-22 ENCOUNTER — Encounter: Payer: Self-pay | Admitting: Gastroenterology

## 2020-07-22 VITALS — BP 140/78 | HR 102 | Ht 66.0 in | Wt 214.0 lb

## 2020-07-22 DIAGNOSIS — K219 Gastro-esophageal reflux disease without esophagitis: Secondary | ICD-10-CM

## 2020-07-22 DIAGNOSIS — R109 Unspecified abdominal pain: Secondary | ICD-10-CM | POA: Diagnosis not present

## 2020-07-22 DIAGNOSIS — K9041 Non-celiac gluten sensitivity: Secondary | ICD-10-CM | POA: Diagnosis not present

## 2020-07-22 DIAGNOSIS — K5909 Other constipation: Secondary | ICD-10-CM

## 2020-07-22 MED ORDER — MOTEGRITY 2 MG PO TABS: 2.0000 mg | ORAL_TABLET | Freq: Every day | ORAL | 0 refills | Status: DC

## 2020-07-22 MED ORDER — DICYCLOMINE HCL 10 MG PO CAPS: 10.0000 mg | ORAL_CAPSULE | Freq: Three times a day (TID) | ORAL | 3 refills | Status: DC | PRN

## 2020-07-22 NOTE — Progress Notes (Signed)
HPI :  60 year old female here for a follow-up visit for altered bowel habits, reflux, reported history of celiac disease.  She has a history of chronic constipation, states this has been ongoing for some time.  She has been on Linzess for the past 3 years, taking 290 mcg/day.  She also takes MiraLAX daily as needed on top of this if needed.  She states initially when she took the [place] it worked a bit more reliably.  She still thinks it does provide benefit but is not as effective as it used to be.  She can still go upwards of 3 to 4 days without a bowel movement at times, but then when she does have a bowel movement she may have multiple loose stools that come out.  She does have some abdominal cramping when she uses the bathroom and intermittently otherwise.  She states she has been on Amitiza before and failed it.  MiraLAX monotherapy was also not effective.  She states she was told she had celiac disease about 3 years ago when she was diagnosed in [place].  She states she had a lab test that was positive but has never had a prior upper endoscopy.  She was placed on a gluten-free diet a few years ago and is 100% gluten-free.  She states she definitely feels better when she is on a gluten-free diet compared to when she takes gluten.  She reports she has food allergies, specifically to pork in the past which she also avoids.  She denies any other family history of celiac disease.  No family history of colon or esophageal cancer or stomach cancer.  She does have a history of reflux, takes 20 mg of omeprazole a day to control the symptoms.  She states pyrosis is fairly well controlled on the medication.  She has been on this for quite a long time.  Has never had an endoscopy for Barrett's screening.  She does have a history of ischemic colitis diagnosed in 2010 that occurred in the setting of NSAIDs, potentially thought Celebrex was the cause of that.  Since her last visit with [place] we did obtain TTG  IgA level which was negative. When she was back in May she again last year she had a CT scan of March 2021 for abdominal pain and rectal bleeding and was thought to have some mild thickening of the sigmoid colon as well as mild dilation of the pancreatic duct measuring up to 4 mm.  This led to a follow-up colonoscopy with me in April 2021, she had 3 small polyps removed, diverticulosis of the colon but nothing to account for prior CT changes.  Repeat colonoscopy was recommended in 5 years.  Unclear if she had an infectious colitis causing CT scan or potentially recurrent ischemic colitis.  She also had an MRI performed of her abdomen in April 2021 which showed no abnormalities of the pancreas.  Most recent workup: Colonoscopy 07/25/19 - The examined portion of the ileum was normal. - Two 2 to 3 mm polyps in the ascending colon, removed with a cold snare. Resected and Retrieved. - One 3 mm polyp in the transverse colon, removed with a cold snare. Resected and retrieved. - Medium-sized lipoma in the transverse colon. - Tortuous colon. - Diverticulosis in the transverse colon and in the ascending colon. - Internal hemorrhoids. - The examination was otherwise normal. No abnormalities on this exam to account for prior CT changes, which were likely representative of self limited inflammatory process, ?  infetious vs. ischemic colitis?  Repeat colonoscopy in 5 years   MRI abdomen 08/01/19 - IMPRESSION: Normal appearance of the pancreatic parenchyma and normal caliber of the pancreatic duct. No evidence of pancreatitis, pancreatic ductal dilatation, or pancreatic fluid collection.    Past Medical History:  Diagnosis Date  . Abdominal hernia   . Beta thalassemia ([place])   . GERD (gastroesophageal reflux disease)   . High blood pressure   . Irritable bowel syndrome   . Ischemic colitis ([place])   . Lactose intolerance      Past Surgical History:  Procedure Laterality Date  . ABDOMINAL  HYSTERECTOMY    . APPENDECTOMY    . CESAREAN SECTION    . COLONOSCOPY    . KNEE SURGERY Right    x 3  . KNEE SURGERY Left    x 2  . ROTATOR CUFF REPAIR Left   . SHOULDER SURGERY Bilateral    for thoracic outlet syndrome  . TONSILLECTOMY    . UPPER GASTROINTESTINAL ENDOSCOPY     Family History  Problem Relation Age of Onset  . Cirrhosis Father        alcohol related  . Liver disease Sister        liver failure  . Colon cancer Neg Hx   . Esophageal cancer Neg Hx   . Stomach cancer Neg Hx   . Pancreatic cancer Neg Hx   . Rectal cancer Neg Hx    Social History   Tobacco Use  . Smoking status: Never Smoker  . Smokeless tobacco: Never Used  Vaping Use  . Vaping Use: Never used  Substance Use Topics  . Alcohol use: Never  . Drug use: Never   Current Outpatient Medications  Medication Sig Dispense Refill  . acetaminophen (TYLENOL) 500 MG tablet Take 1,000 mg by mouth every 8 (eight) hours as needed.    [name] amlodipine-olmesartan (AZOR) 10-20 MG tablet Take 1 tablet by mouth daily. 90 tablet 1  . aspirin 81 MG chewable tablet Chew 1 tablet by mouth daily.    . B Complex-Biotin-FA (B-100 COMPLEX PO) Take 1 capsule by mouth daily.    . benzonatate (TESSALON) 200 MG capsule Take 200 mg by mouth 3 (three) times daily.    . carvedilol (COREG) 25 MG tablet Take 1 tablet (25 mg total) by mouth 2 (two) times daily. 180 tablet 2  . Cholecalciferol (VITAMIN D3) 25 MCG (1000 UT) CAPS Take 1 capsule by mouth daily.    . diclofenac Sodium (VOLTAREN) 1 % GEL Apply 4 g topically 4 (four) times daily.    [name] DIGESTIVE ENZYMES PO Take 1 capsule by mouth 2 (two) times daily with a meal.    . DULoxetine (CYMBALTA) 60 MG capsule Take 1 capsule (60 mg total) by mouth daily. (Patient taking differently: Take 60 mg by mouth daily. Patient taking 90 mg daily) 90 capsule 1  . ferrous sulfate 325 (65 FE) MG tablet Take 1 tablet by mouth daily.    . fexofenadine (ALLEGRA) 180 MG tablet Take 1 tablet (180 mg  total) by mouth daily. 90 tablet 3  . fluticasone (FLONASE) 50 MCG/ACT nasal spray Place into the nose.    . gabapentin (NEURONTIN) 600 MG tablet Take 1 tablet (600 mg total) by mouth 3 (three) times daily. 270 tablet 1  . LINZESS 290 MCG CAPS capsule Take 1 capsule (290 mcg total) by mouth daily. You will be due for an office visit in April. Please call to schedule an appointment  in March.  Thank you 90 capsule 0  . nitroGLYCERIN (NITROSTAT) 0.4 MG SL tablet Place under the tongue as needed.    . Omeprazole 20 MG TBEC Take 1 tablet by mouth daily.    . ondansetron (ZOFRAN) 4 MG tablet Take 1 tablet (4 mg total) by mouth every 8 (eight) hours as needed. 20 tablet 2  . SUMAtriptan (IMITREX) 50 MG tablet Take 1 tablet (50 mg total) by mouth every 2 (two) hours as needed for migraine. May repeat once in 2 hours if headache persists or recurs. 10 tablet 2  . tiZANidine (ZANAFLEX) 4 MG tablet Take 4 mg by mouth 3 (three) times daily as needed.    . traMADol (ULTRAM) 50 MG tablet Take 2 tablets (100 mg total) by mouth every 8 (eight) hours as needed. 90 tablet 0  . vitamin C (ASCORBIC ACID) 500 MG tablet Take 500 mg by mouth daily.    . promethazine (PHENERGAN) 25 MG tablet Take 1 tablet (25 mg total) by mouth every 6 (six) hours as needed for nausea or vomiting. 30 tablet 5   No current facility-administered medications for this visit.   Allergies  Allergen Reactions  . Pork-Derived Products Anaphylaxis  . Codeine Nausea And Vomiting    documented per BFML paper chart/ EMR   . Morphine Nausea And Vomiting  . Penicillins Hives and Rash  . Aspirin   . Baclofen   . Gluten Meal Cough, Diarrhea, Nausea And Vomiting and Swelling  . Nsaids   . Latex Rash     Review of Systems: All systems reviewed and negative except where noted in HPI.   Lab Results  Component Value Date   WBC 7.1 06/16/2020   HGB 11.9 (L) 06/16/2020   HCT 36.3 06/16/2020   MCV 71.6 (L) 06/16/2020   PLT 388.0 06/16/2020     Lab Results  Component Value Date   IRON 86 06/16/2020   TIBC 322 06/16/2020   FERRITIN 127 06/16/2020    Lab Results  Component Value Date   CREATININE 0.77 06/16/2020   BUN 11 06/16/2020   NA 143 06/16/2020   K 3.8 06/16/2020   CL 104 06/16/2020   CO2 25 06/16/2020    Lab Results  Component Value Date   ALT 16 12/17/2019   AST 16 12/17/2019   ALKPHOS 83 12/17/2019   BILITOT 0.5 12/17/2019     Physical Exam: BP 140/78   Pulse (!) 102   Ht 5\' 6"  (1.676 m)   Wt 214 lb (97.1 kg)   BMI 34.54 kg/m  Constitutional: Pleasant,well-developed, female in no acute distress. Neurological: Alert and oriented to person place and time. Skin: Skin is warm and dry. No rashes noted. Psychiatric: Normal mood and affect. Behavior is normal.   ASSESSMENT AND PLAN: 60 year old female here for reassessment of the following:  Chronic constipation Abdominal cramping Gluten intolerance GERD  We discussed all these issues as outlined above.  Sounds like benefit of Linzess has waned over time and now combining with MiraLAX and still having some problems with constipation.  Her colonoscopy is up-to-date and otherwise looks okay.  We discussed options.  She has failed Amitiza in the past.  We had some free samples of Motegrity in the office today, we will try that daily and see if she responds any better to that and hold the [place] for now.  She can take MiraLAX as needed on top of that.  If Motegrity works for her she should let us  know and we can send a prescription for that.  For her abdominal cramping we will also add Bentyl 10 mg every 8 hours to take as needed to see if that helps.  We discussed what celiac disease is.  Her lab for this was negative, unclear how she was diagnosed with this in the past when in [place], she has never had an endoscopy.  I suspect it may be more likely she is gluten intolerant rather than truly having celiac disease given her ethnicity although it does  remain possible.  Given this question and her history of reflux, I offered her an upper endoscopy to further evaluate for this.  She is under percent gluten-free so if the exam is normal it does not necessarily rule out celiac and may consider genetic testing and that setting.  I discussed risk benefits of endoscopy and anesthesia with her and she wants to proceed.  She will continue her omeprazole for now and if she has celiac disease or not, she will continue gluten-free diet as she clearly has intolerance to gluten.  Plan: - hold Linzess - trial of Motegrity daily - continue Miralax PRN - add Bentyl 10mg  every 8 hours PRN - continue gluten free diet - continue omeprazole - EGD to be scheduled, further recommendations pending this result and her course  Heath Cellar, MD [facility]

## 2020-07-22 NOTE — Patient Instructions (Addendum)
If you are age 62 or older, your body mass index should be between 23-30. Your Body mass index is 34.54 kg/m. If this is out of the aforementioned range listed, please consider follow up with your Primary Care Provider.  If you are age 39 or younger, your body mass index should be between 19-25. Your Body mass index is 34.54 kg/m. If this is out of the aformentioned range listed, please consider follow up with your Primary Care Provider.    We have given you samples of the following medication to take: Motegrity 2 mg: Take once daily  Continue Miralax - once daily. Titrate up as needed  We have sent the following medications to your pharmacy for you to pick up at your convenience: Bentyl 10 mg: Take every 8 hours as needed  Continue a gluten free diet.  You have been scheduled for an endoscopy. Please follow written instructions given to you at your visit today. If you use inhalers (even only as needed), please bring them with you on the day of your procedure.   Thank you for entrusting me with your care and for choosing [facility], Dr. [place] Cellar

## 2020-07-23 DIAGNOSIS — M25561 Pain in right knee: Secondary | ICD-10-CM | POA: Diagnosis not present

## 2020-07-28 ENCOUNTER — Other Ambulatory Visit: Payer: Self-pay | Admitting: Pulmonary Disease

## 2020-07-28 ENCOUNTER — Telehealth: Payer: Self-pay | Admitting: Pulmonary Disease

## 2020-07-28 DIAGNOSIS — G4733 Obstructive sleep apnea (adult) (pediatric): Secondary | ICD-10-CM

## 2020-07-28 NOTE — Telephone Encounter (Signed)
Tiffany Martin, [name]  P [facility] Clinical Pool I hit the send before I was done.  I was constipated and my stomach was cramping until Sunday night after I took OTC laxatives.  Do u want me to give the [place] another week?  Dicyclomine didn't help with the cramps/stomach pains at all.  Please advise....  Thank U.

## 2020-07-28 NOTE — Telephone Encounter (Signed)
Please call patient to let her know that we can treat her based on a recent sleep study that she had done on 05/17/2020 -I had requested for a sleep study, was not aware that one was already done  Study did show moderate obstructive sleep apnea  Recommendation DME referral  Recommend CPAP therapy for moderate obstructive sleep apnea  Auto titrating CPAP with pressure settings of 5-15 will be appropriate  Encourage weight loss measures  Follow-up in the office 4 to 6 weeks following initiation of treatment

## 2020-07-28 NOTE — Telephone Encounter (Signed)
Received a message from patient. She was calling to make sure that we were aware that her insurance required a pre-certification. I advised her that per her chart, it looks the PCCs have already gotten this taken care of. She wants to know if she can go ahead and get scheduled for the HST.   PCCs, can you please advise? Thanks.

## 2020-07-28 NOTE — Telephone Encounter (Signed)
See alternate patient message. 

## 2020-07-28 NOTE — Telephone Encounter (Signed)
I called and spoke with patient regarding HST results and Dr. Doreene Eland. Patient verbalized understanding and is agreeable to trying CPAP. I sent in order for CPAP with requested pressures and notified patient of delay and DME will reach out. Patient verbalized understanding and will call to make a f/u appt when machine is delivered. Nothing further needed at this time.

## 2020-08-01 MED ORDER — MOTEGRITY 2 MG PO TABS: 2.0000 mg | ORAL_TABLET | Freq: Every day | ORAL | 1 refills | Status: DC

## 2020-08-01 NOTE — Telephone Encounter (Signed)
Motegrity prescription sent to pharmacy on file.

## 2020-08-05 ENCOUNTER — Telehealth: Payer: Self-pay

## 2020-08-05 NOTE — Telephone Encounter (Signed)
PA for Motegrity has been apporved through 08-01-21.[employer]. [phone]

## 2020-08-07 DIAGNOSIS — M47812 Spondylosis without myelopathy or radiculopathy, cervical region: Secondary | ICD-10-CM | POA: Diagnosis not present

## 2020-08-07 DIAGNOSIS — M5416 Radiculopathy, lumbar region: Secondary | ICD-10-CM | POA: Diagnosis not present

## 2020-08-07 DIAGNOSIS — M47816 Spondylosis without myelopathy or radiculopathy, lumbar region: Secondary | ICD-10-CM | POA: Diagnosis not present

## 2020-08-07 DIAGNOSIS — G894 Chronic pain syndrome: Secondary | ICD-10-CM | POA: Diagnosis not present

## 2020-08-13 ENCOUNTER — Encounter: Payer: Self-pay | Admitting: Family Medicine

## 2020-09-10 DIAGNOSIS — G8918 Other acute postprocedural pain: Secondary | ICD-10-CM | POA: Diagnosis not present

## 2020-09-10 DIAGNOSIS — S83261A Peripheral tear of lateral meniscus, current injury, right knee, initial encounter: Secondary | ICD-10-CM | POA: Diagnosis not present

## 2020-09-10 DIAGNOSIS — X58XXXA Exposure to other specified factors, initial encounter: Secondary | ICD-10-CM | POA: Diagnosis not present

## 2020-09-10 DIAGNOSIS — S83281A Other tear of lateral meniscus, current injury, right knee, initial encounter: Secondary | ICD-10-CM | POA: Diagnosis not present

## 2020-09-10 DIAGNOSIS — M94261 Chondromalacia, right knee: Secondary | ICD-10-CM | POA: Diagnosis not present

## 2020-09-10 DIAGNOSIS — S83231A Complex tear of medial meniscus, current injury, right knee, initial encounter: Secondary | ICD-10-CM | POA: Diagnosis not present

## 2020-09-10 DIAGNOSIS — Y999 Unspecified external cause status: Secondary | ICD-10-CM | POA: Diagnosis not present

## 2020-09-17 ENCOUNTER — Encounter: Payer: Federal, State, Local not specified - PPO | Admitting: Gastroenterology

## 2020-10-07 DIAGNOSIS — M5416 Radiculopathy, lumbar region: Secondary | ICD-10-CM | POA: Diagnosis not present

## 2020-10-07 DIAGNOSIS — M47816 Spondylosis without myelopathy or radiculopathy, lumbar region: Secondary | ICD-10-CM | POA: Diagnosis not present

## 2020-10-07 DIAGNOSIS — Z79891 Long term (current) use of opiate analgesic: Secondary | ICD-10-CM | POA: Diagnosis not present

## 2020-10-07 DIAGNOSIS — M47812 Spondylosis without myelopathy or radiculopathy, cervical region: Secondary | ICD-10-CM | POA: Diagnosis not present

## 2020-10-07 DIAGNOSIS — G894 Chronic pain syndrome: Secondary | ICD-10-CM | POA: Diagnosis not present

## 2020-10-28 DIAGNOSIS — M25561 Pain in right knee: Secondary | ICD-10-CM | POA: Diagnosis not present

## 2020-10-28 DIAGNOSIS — Z4789 Encounter for other orthopedic aftercare: Secondary | ICD-10-CM | POA: Diagnosis not present

## 2020-11-20 DIAGNOSIS — M47816 Spondylosis without myelopathy or radiculopathy, lumbar region: Secondary | ICD-10-CM | POA: Diagnosis not present

## 2020-11-20 DIAGNOSIS — G894 Chronic pain syndrome: Secondary | ICD-10-CM | POA: Diagnosis not present

## 2020-11-20 DIAGNOSIS — M5416 Radiculopathy, lumbar region: Secondary | ICD-10-CM | POA: Diagnosis not present

## 2020-11-20 DIAGNOSIS — M47812 Spondylosis without myelopathy or radiculopathy, cervical region: Secondary | ICD-10-CM | POA: Diagnosis not present

## 2020-11-28 ENCOUNTER — Ambulatory Visit: Payer: Federal, State, Local not specified - PPO | Admitting: Family Medicine

## 2020-11-28 ENCOUNTER — Encounter: Payer: Self-pay | Admitting: Family Medicine

## 2020-11-28 ENCOUNTER — Other Ambulatory Visit: Payer: Self-pay

## 2020-11-28 VITALS — BP 118/76 | HR 81 | Temp 97.8°F | Ht 66.0 in | Wt 212.0 lb

## 2020-11-28 DIAGNOSIS — Z13820 Encounter for screening for osteoporosis: Secondary | ICD-10-CM

## 2020-11-28 DIAGNOSIS — D509 Iron deficiency anemia, unspecified: Secondary | ICD-10-CM

## 2020-11-28 DIAGNOSIS — I10 Essential (primary) hypertension: Secondary | ICD-10-CM

## 2020-11-28 DIAGNOSIS — Z0000 Encounter for general adult medical examination without abnormal findings: Secondary | ICD-10-CM | POA: Diagnosis not present

## 2020-11-28 DIAGNOSIS — E611 Iron deficiency: Secondary | ICD-10-CM

## 2020-11-28 DIAGNOSIS — G4733 Obstructive sleep apnea (adult) (pediatric): Secondary | ICD-10-CM | POA: Diagnosis not present

## 2020-11-28 DIAGNOSIS — T7840XA Allergy, unspecified, initial encounter: Secondary | ICD-10-CM | POA: Insufficient documentation

## 2020-11-28 DIAGNOSIS — R7309 Other abnormal glucose: Secondary | ICD-10-CM | POA: Diagnosis not present

## 2020-11-28 DIAGNOSIS — Z1231 Encounter for screening mammogram for malignant neoplasm of breast: Secondary | ICD-10-CM

## 2020-11-28 LAB — CBC
HCT: 35.6 % — ABNORMAL LOW (ref 36.0–46.0)
Hemoglobin: 11.4 g/dL — ABNORMAL LOW (ref 12.0–15.0)
MCHC: 31.9 g/dL (ref 30.0–36.0)
MCV: 72.8 fl — ABNORMAL LOW (ref 78.0–100.0)
Platelets: 373 10*3/uL (ref 150.0–400.0)
RBC: 4.89 Mil/uL (ref 3.87–5.11)
RDW: 17.8 % — ABNORMAL HIGH (ref 11.5–15.5)
WBC: 6.5 10*3/uL (ref 4.0–10.5)

## 2020-11-28 LAB — BASIC METABOLIC PANEL
BUN: 11 mg/dL (ref 6–23)
CO2: 28 mEq/L (ref 19–32)
Calcium: 10.1 mg/dL (ref 8.4–10.5)
Chloride: 105 mEq/L (ref 96–112)
Creatinine, Ser: 0.84 mg/dL (ref 0.40–1.20)
GFR: 74.82 mL/min (ref >60.00)
Glucose, Bld: 100 mg/dL — ABNORMAL HIGH (ref 70–99)
Potassium: 4.5 mEq/L (ref 3.5–5.1)
Sodium: 144 mEq/L (ref 135–145)

## 2020-11-28 LAB — HEMOGLOBIN A1C: Hgb A1c MFr Bld: 5.9 % (ref 4.6–6.5)

## 2020-11-28 NOTE — Progress Notes (Addendum)
Established Patient Office Visit  Subjective:  Patient ID: Tiffany Martin, female    DOB: [birth year]  Age: 60 y.o. MRN: [id number]  CC:  Chief Complaint  Patient presents with   Referral    Patient would like a referral to eye doctor and allergist.     HPI Tiffany Martin presents for follow-up of hypertension, allergic reaction, health maintenance, elevated glucose, microcytic anemia with normal iron, and obesity.  Unable to exercise status post recent knee surgery.  Knee is improving however.  Sounds like she developed allergic reactions after post surgery knee injections.  She had developed a flulike illness after each of 3 injections.  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  Glucose has been elevated.  We will be checking hemoglobin A1c.  Diagnosed with what sounds like may be glaucoma up in [place].  She is in need of follow-up with an ophthalmologist.  Vision is corrected and currently having no visual difficulties.  History of ocular migraines.  Needs referral for bone density amatory and mammogram.  Status post hysterectomy no longer in need of Pap smears.  Seen GI for GERD and her previous diagnosis of celiac disease.  Past Medical History:  Diagnosis Date   Abdominal hernia    Beta thalassemia ([facility])    GERD (gastroesophageal reflux disease)    High blood pressure    Irritable bowel syndrome    Ischemic colitis ([facility])    Lactose intolerance     Past Surgical History:  Procedure Laterality Date   ABDOMINAL HYSTERECTOMY     APPENDECTOMY     CESAREAN SECTION     COLONOSCOPY     KNEE SURGERY Right    x 3   KNEE SURGERY Left    x 2   ROTATOR CUFF REPAIR Left    SHOULDER SURGERY Bilateral    for thoracic outlet syndrome   TONSILLECTOMY     UPPER GASTROINTESTINAL ENDOSCOPY      Family History  Problem Relation Age of Onset   Cirrhosis Father        alcohol related   Liver disease Sister        liver failure   Colon cancer Neg Hx    Esophageal cancer Neg Hx    Stomach cancer Neg  Hx    Pancreatic cancer Neg Hx    Rectal cancer Neg Hx     Social History   Socioeconomic History   Marital status: Single    Spouse name: Not on file   Number of children: Not on file   Years of education: Not on file   Highest education level: Not on file  Occupational History   Not on file  Tobacco Use   Smoking status: Never   Smokeless tobacco: Never  Vaping Use   Vaping Use: Never used  Substance and Sexual Activity   Alcohol use: Never   Drug use: Never   Sexual activity: Not on file  Other Topics Concern   Not on file  Social History Narrative   Not on file   Social Determinants of Health   Financial Resource Strain: Not on file  Food Insecurity: Not on file  Transportation Needs: Not on file  Physical Activity: Not on file  Stress: Not on file  Social Connections: Not on file  Intimate Partner Violence: Not on file    Outpatient Medications Prior to Visit  Medication Sig Dispense Refill   acetaminophen (TYLENOL) 500 MG tablet Take 1,000 mg by mouth every 8 (eight) hours as  needed.     amlodipine-olmesartan (AZOR) 10-20 MG tablet Take 1 tablet by mouth daily. 90 tablet 1   aspirin 81 MG chewable tablet Chew 1 tablet by mouth daily.     B Complex-Biotin-FA (B-100 COMPLEX PO) Take 1 capsule by mouth daily.     benzonatate (TESSALON) 200 MG capsule Take 200 mg by mouth 3 (three) times daily.     carvedilol (COREG) 25 MG tablet Take 1 tablet (25 mg total) by mouth 2 (two) times daily. 180 tablet 2   Cholecalciferol (VITAMIN D3) 25 MCG (1000 UT) CAPS Take 1 capsule by mouth daily.     DIGESTIVE ENZYMES PO Take 1 capsule by mouth 2 (two) times daily with a meal.     DULoxetine (CYMBALTA) 60 MG capsule Take 1 capsule (60 mg total) by mouth daily. (Patient taking differently: Take 60 mg by mouth daily. Patient taking 90 mg daily) 90 capsule 1   ferrous sulfate 325 (65 FE) MG tablet Take 1 tablet by mouth daily.     fexofenadine (ALLEGRA) 180 MG tablet Take 1 tablet  (180 mg total) by mouth daily. 90 tablet 3   fluticasone (FLONASE) 50 MCG/ACT nasal spray Place into the nose.     gabapentin (NEURONTIN) 600 MG tablet Take 1 tablet (600 mg total) by mouth 3 (three) times daily. 270 tablet 1   nitroGLYCERIN (NITROSTAT) 0.4 MG SL tablet Place under the tongue as needed.     Omeprazole 20 MG TBEC Take 1 tablet by mouth daily.     ondansetron (ZOFRAN) 4 MG tablet Take 1 tablet (4 mg total) by mouth every 8 (eight) hours as needed. 20 tablet 2   SUMAtriptan (IMITREX) 50 MG tablet Take 1 tablet (50 mg total) by mouth every 2 (two) hours as needed for migraine. May repeat once in 2 hours if headache persists or recurs. 10 tablet 2   tiZANidine (ZANAFLEX) 4 MG tablet Take 4 mg by mouth 3 (three) times daily as needed.     traMADol (ULTRAM) 50 MG tablet Take 2 tablets (100 mg total) by mouth every 8 (eight) hours as needed. 90 tablet 0   vitamin C (ASCORBIC ACID) 500 MG tablet Take 500 mg by mouth daily.     diclofenac Sodium (VOLTAREN) 1 % GEL Apply 4 g topically 4 (four) times daily.     dicyclomine (BENTYL) 10 MG capsule Take 1 capsule (10 mg total) by mouth every 8 (eight) hours as needed for spasms. 30 capsule 3   promethazine (PHENERGAN) 25 MG tablet Take 1 tablet (25 mg total) by mouth every 6 (six) hours as needed for nausea or vomiting. 30 tablet 5   Prucalopride Succinate (MOTEGRITY) 2 MG TABS Take 1 tablet (2 mg total) by mouth daily. 30 tablet 1   No facility-administered medications prior to visit.    Allergies  Allergen Reactions   Pork-Derived Products Anaphylaxis   Codeine Nausea And Vomiting    documented per BFML paper chart/ EMR    Morphine Nausea And Vomiting   Penicillins Hives and Rash   Aspirin    Baclofen    Gluten Meal Cough, Diarrhea, Nausea And Vomiting and Swelling   Nsaids    Latex Rash    ROS Review of Systems  Constitutional:  Negative for chills, diaphoresis, fatigue, fever and unexpected weight change.  HENT: Negative.     Eyes:  Negative for photophobia and visual disturbance.  Respiratory: Negative.    Cardiovascular: Negative.   Gastrointestinal: Negative.  Endocrine: Negative for polyphagia and polyuria.  Genitourinary: Negative.   Musculoskeletal:  Positive for arthralgias.  Neurological:  Negative for speech difficulty and weakness.  Psychiatric/Behavioral: Negative.       Objective:    Physical Exam Vitals and nursing note reviewed.  Constitutional:      General: She is not in acute distress.    Appearance: Normal appearance. She is not ill-appearing, toxic-appearing or diaphoretic.  HENT:     Head: Normocephalic and atraumatic.     Right Ear: Tympanic membrane, ear canal and external ear normal.     Left Ear: Tympanic membrane, ear canal and external ear normal.     Mouth/Throat:     Mouth: Mucous membranes are moist.     Pharynx: Oropharynx is clear. No oropharyngeal exudate or posterior oropharyngeal erythema.  Eyes:     General: No scleral icterus.       Right eye: No discharge.        Left eye: No discharge.     Extraocular Movements: Extraocular movements intact.     Conjunctiva/sclera: Conjunctivae normal.     Pupils: Pupils are equal, round, and reactive to light.  Neck:     Vascular: No carotid bruit.  Cardiovascular:     Rate and Rhythm: Normal rate and regular rhythm.  Pulmonary:     Effort: Pulmonary effort is normal.     Breath sounds: Normal breath sounds.  Musculoskeletal:     Cervical back: No rigidity or tenderness.  Lymphadenopathy:     Cervical: No cervical adenopathy.  Skin:    General: Skin is warm and dry.  Neurological:     Mental Status: She is alert and oriented to person, place, and time.    BP 118/76 (BP Location: Right Arm, Patient Position: Sitting, Cuff Size: Normal)   Pulse 81   Temp 97.8 F (36.6 C) (Temporal)   Ht '5\' 6"'$  (1.676 m)   Wt 212 lb (96.2 kg)   SpO2 94%   BMI 34.22 kg/m  Wt Readings from Last 3 Encounters:  11/28/20 212 lb  (96.2 kg)  07/22/20 214 lb (97.1 kg)  06/19/20 215 lb (97.5 kg)     Health Maintenance Due  Topic Date Due   HIV Screening  Never done   Hepatitis C Screening  Never done   TETANUS/TDAP  Never done   Pneumococcal Vaccine 78-61 Years old (2 - PPSV23 or PCV20) 06/09/2016   INFLUENZA VACCINE  11/03/2020   MAMMOGRAM  11/15/2020   Zoster Vaccines- Shingrix (2 of 2) 12/03/2020    There are no preventive care reminders to display for this patient.  Lab Results  Component Value Date   TSH 1.20 03/05/2019   Lab Results  Component Value Date   WBC 6.5 11/28/2020   HGB 11.4 (L) 11/28/2020   HCT 35.6 (L) 11/28/2020   MCV 72.8 (L) 11/28/2020   PLT 373.0 11/28/2020   Lab Results  Component Value Date   NA 144 11/28/2020   K 4.5 11/28/2020   CO2 28 11/28/2020   GLUCOSE 100 (H) 11/28/2020   BUN 11 11/28/2020   CREATININE 0.84 11/28/2020   BILITOT 0.5 12/17/2019   ALKPHOS 83 12/17/2019   AST 16 12/17/2019   ALT 16 12/17/2019   PROT 7.6 12/17/2019   ALBUMIN 4.7 12/17/2019   CALCIUM 10.1 11/28/2020   GFR 74.82 11/28/2020   Lab Results  Component Value Date   CHOL 157 03/20/2019   Lab Results  Component Value Date   HDL  47.70 03/20/2019   Lab Results  Component Value Date   LDLCALC 78 03/20/2019   Lab Results  Component Value Date   TRIG 160.0 (H) 03/20/2019   Lab Results  Component Value Date   CHOLHDL 3 03/20/2019   Lab Results  Component Value Date   HGBA1C 5.9 11/28/2020      Assessment & Plan:   Problem List Items Addressed This Visit       Cardiovascular and Mediastinum   Essential hypertension - Primary   Relevant Orders   Basic metabolic panel (Completed)   Urinalysis, Routine w reflex microscopic     Respiratory   Obstructive sleep apnea syndrome     Other   Screening for osteoporosis   Relevant Orders   DG Bone Density   Iron deficiency   Relevant Orders   CBC (Completed)   Iron, TIBC and Ferritin Panel (Completed)   Microcytic  anemia   Relevant Orders   CBC (Completed)   Hgb Fractionation Cascade (Completed)   Elevated glucose   Relevant Orders   Hemoglobin A1c (Completed)   Allergic drug reaction   Relevant Orders   Ambulatory referral to Allergy   Morbid obesity ([place])    No orders of the defined types were placed in this encounter.   Follow-up: Return in about 6 months (around 05/31/2021).  Discussed weight loss strategy as calorie consumption outweighs exercise but exercise remains important.  Given information on calorie counting to lose weight.  Libby Maw, MD

## 2020-11-29 LAB — IRON,TIBC AND FERRITIN PANEL
%SAT: 29 % (calc) (ref 16–45)
Ferritin: 157 ng/mL (ref 16–288)
Iron: 89 ug/dL (ref 45–160)
TIBC: 302 mcg/dL (calc) (ref 250–450)

## 2020-12-01 LAB — HGB FRACTIONATION CASCADE
Hgb A2: 7.7 % — ABNORMAL HIGH (ref 1.8–3.2)
Hgb A: 88.4 % — ABNORMAL LOW (ref 96.4–98.8)
Hgb F: 3.9 % — ABNORMAL HIGH (ref 0.0–2.0)
Hgb S: 0 %

## 2020-12-15 ENCOUNTER — Telehealth: Payer: Self-pay | Admitting: Family Medicine

## 2020-12-15 NOTE — Telephone Encounter (Signed)
[facility] called and they need more details about the screening Dr Ethelene Hal ordered.  [phone]

## 2020-12-16 NOTE — Telephone Encounter (Signed)
Spoke to [facility] and was advised that the Mammogram and the bone desity that was order can't be done with the DX: healthcare maintience it will have to be for screening.   Please review and advise.   Thanks. Dm/cma

## 2020-12-16 NOTE — Addendum Note (Signed)
Addended by: Jon Billings on: 12/16/2020 11:37 AM   Modules accepted: Orders

## 2020-12-17 NOTE — Addendum Note (Signed)
Addended by: Abelino Derrick A on: 12/17/2020 12:20 PM   Modules accepted: Orders

## 2020-12-17 NOTE — Telephone Encounter (Signed)
Please advise message below  °

## 2020-12-17 NOTE — Telephone Encounter (Signed)
Spoke with [place] at [facility], they have received the new code.

## 2020-12-18 DIAGNOSIS — M47816 Spondylosis without myelopathy or radiculopathy, lumbar region: Secondary | ICD-10-CM | POA: Diagnosis not present

## 2020-12-18 DIAGNOSIS — M47812 Spondylosis without myelopathy or radiculopathy, cervical region: Secondary | ICD-10-CM | POA: Diagnosis not present

## 2020-12-18 DIAGNOSIS — M5416 Radiculopathy, lumbar region: Secondary | ICD-10-CM | POA: Diagnosis not present

## 2020-12-18 DIAGNOSIS — G894 Chronic pain syndrome: Secondary | ICD-10-CM | POA: Diagnosis not present

## 2020-12-22 DIAGNOSIS — H40033 Anatomical narrow angle, bilateral: Secondary | ICD-10-CM | POA: Diagnosis not present

## 2020-12-22 DIAGNOSIS — H1013 Acute atopic conjunctivitis, bilateral: Secondary | ICD-10-CM | POA: Diagnosis not present

## 2021-01-15 DIAGNOSIS — G894 Chronic pain syndrome: Secondary | ICD-10-CM | POA: Diagnosis not present

## 2021-01-15 DIAGNOSIS — M47812 Spondylosis without myelopathy or radiculopathy, cervical region: Secondary | ICD-10-CM | POA: Diagnosis not present

## 2021-01-15 DIAGNOSIS — M5416 Radiculopathy, lumbar region: Secondary | ICD-10-CM | POA: Diagnosis not present

## 2021-01-15 DIAGNOSIS — M47816 Spondylosis without myelopathy or radiculopathy, lumbar region: Secondary | ICD-10-CM | POA: Diagnosis not present

## 2021-02-05 ENCOUNTER — Ambulatory Visit: Payer: Federal, State, Local not specified - PPO | Admitting: Allergy

## 2021-02-12 DIAGNOSIS — G894 Chronic pain syndrome: Secondary | ICD-10-CM | POA: Diagnosis not present

## 2021-02-12 DIAGNOSIS — M47812 Spondylosis without myelopathy or radiculopathy, cervical region: Secondary | ICD-10-CM | POA: Diagnosis not present

## 2021-02-12 DIAGNOSIS — M5416 Radiculopathy, lumbar region: Secondary | ICD-10-CM | POA: Diagnosis not present

## 2021-02-12 DIAGNOSIS — M47816 Spondylosis without myelopathy or radiculopathy, lumbar region: Secondary | ICD-10-CM | POA: Diagnosis not present

## 2021-03-12 DIAGNOSIS — M47816 Spondylosis without myelopathy or radiculopathy, lumbar region: Secondary | ICD-10-CM | POA: Diagnosis not present

## 2021-03-12 DIAGNOSIS — G894 Chronic pain syndrome: Secondary | ICD-10-CM | POA: Diagnosis not present

## 2021-03-12 DIAGNOSIS — M5416 Radiculopathy, lumbar region: Secondary | ICD-10-CM | POA: Diagnosis not present

## 2021-03-12 DIAGNOSIS — M47812 Spondylosis without myelopathy or radiculopathy, cervical region: Secondary | ICD-10-CM | POA: Diagnosis not present

## 2021-04-15 ENCOUNTER — Ambulatory Visit: Payer: Self-pay | Admitting: Allergy

## 2021-04-15 DIAGNOSIS — M47812 Spondylosis without myelopathy or radiculopathy, cervical region: Secondary | ICD-10-CM | POA: Diagnosis not present

## 2021-04-15 DIAGNOSIS — M47816 Spondylosis without myelopathy or radiculopathy, lumbar region: Secondary | ICD-10-CM | POA: Diagnosis not present

## 2021-04-15 DIAGNOSIS — M5416 Radiculopathy, lumbar region: Secondary | ICD-10-CM | POA: Diagnosis not present

## 2021-04-15 DIAGNOSIS — G894 Chronic pain syndrome: Secondary | ICD-10-CM | POA: Diagnosis not present

## 2021-04-30 ENCOUNTER — Other Ambulatory Visit: Payer: Self-pay | Admitting: Family Medicine

## 2021-04-30 DIAGNOSIS — I10 Essential (primary) hypertension: Secondary | ICD-10-CM

## 2021-05-13 DIAGNOSIS — M5416 Radiculopathy, lumbar region: Secondary | ICD-10-CM | POA: Diagnosis not present

## 2021-05-13 DIAGNOSIS — M47816 Spondylosis without myelopathy or radiculopathy, lumbar region: Secondary | ICD-10-CM | POA: Diagnosis not present

## 2021-05-13 DIAGNOSIS — M47812 Spondylosis without myelopathy or radiculopathy, cervical region: Secondary | ICD-10-CM | POA: Diagnosis not present

## 2021-05-13 DIAGNOSIS — G894 Chronic pain syndrome: Secondary | ICD-10-CM | POA: Diagnosis not present

## 2021-05-15 DIAGNOSIS — Z713 Dietary counseling and surveillance: Secondary | ICD-10-CM | POA: Diagnosis not present

## 2021-05-22 ENCOUNTER — Ambulatory Visit: Payer: Federal, State, Local not specified - PPO | Admitting: Allergy

## 2021-05-22 ENCOUNTER — Other Ambulatory Visit: Payer: Self-pay

## 2021-05-22 ENCOUNTER — Encounter: Payer: Self-pay | Admitting: Allergy

## 2021-05-22 VITALS — BP 128/84 | HR 98 | Temp 97.9°F | Resp 18 | Ht 66.0 in | Wt 213.0 lb

## 2021-05-22 DIAGNOSIS — T781XXD Other adverse food reactions, not elsewhere classified, subsequent encounter: Secondary | ICD-10-CM | POA: Diagnosis not present

## 2021-05-22 DIAGNOSIS — H1013 Acute atopic conjunctivitis, bilateral: Secondary | ICD-10-CM | POA: Diagnosis not present

## 2021-05-22 DIAGNOSIS — T781XXA Other adverse food reactions, not elsewhere classified, initial encounter: Secondary | ICD-10-CM

## 2021-05-22 DIAGNOSIS — J3089 Other allergic rhinitis: Secondary | ICD-10-CM | POA: Diagnosis not present

## 2021-05-22 DIAGNOSIS — J4520 Mild intermittent asthma, uncomplicated: Secondary | ICD-10-CM

## 2021-05-22 DIAGNOSIS — J709 Respiratory conditions due to unspecified external agent: Secondary | ICD-10-CM | POA: Diagnosis not present

## 2021-05-22 MED ORDER — OLOPATADINE HCL 0.2 % OP SOLN: OPHTHALMIC | 1 refills | Status: DC

## 2021-05-22 MED ORDER — CARBINOXAMINE MALEATE 6 MG PO TABS: 1.0000 | ORAL_TABLET | Freq: Two times a day (BID) | ORAL | 1 refills | Status: DC | PRN

## 2021-05-22 MED ORDER — OLOPATADINE-MOMETASONE 665-25 MCG/ACT NA SUSP: 2.0000 | NASAL | 1 refills | Status: DC | PRN

## 2021-05-22 MED ORDER — EPINEPHRINE 0.3 MG/0.3ML IJ SOAJ: 0.3000 mg | Freq: Once | INTRAMUSCULAR | 0 refills | Status: AC

## 2021-05-22 MED ORDER — RYALTRIS 665-25 MCG/ACT NA SUSP: 2.0000 | Freq: Two times a day (BID) | NASAL | 2 refills | Status: DC

## 2021-05-22 NOTE — Patient Instructions (Signed)
-   Lung function testing today - Have access to albuterol inhaler 2 puffs every 4-6 hours as needed for cough/wheeze/shortness of breath/chest tightness.  May use 15-20 minutes prior to activity.   Monitor frequency of use.   - Use with spacer device  - Testing today showed: grasses. - Will obtain environmental allergy panel via bloodwork to see if you have any other allergens - Copy of test results provided.  - Avoidance measures provided. - Stop taking: Allegra and Xyzal as not effective.   Hold flonase as well have you try a combination nasal spray as below - Start taking:Ryaltris (olopatadine/mometasone) two sprays per nostril 1-2 times daily as needed.  This has an nasal steroid (like flonase) and nasal antihistamine to help with congestion and drainage control. Olopatadine 0.2% 1 drop each eye daily as needed for itchy/watery eyes Ryvent 6mg  1 tab twice a day as needed for allergy symptom control.   This is an antihistamine that may work better than Xyzal and Medtronic can use an extra dose of the antihistamine, if needed, for breakthrough symptoms.  - Consider nasal saline rinses 1-2 times daily to remove allergens from the nasal cavities as well as help with mucous clearance (this is especially helpful to do before the nasal sprays are given)  - Continue avoidance of pork product, gluten and dairy (avoid dairy due to lactose intolerance) - Have access to self-injectable epinephrine (Epipen or AuviQ) 0.3mg  at all times - Follow emergency action plan in case of allergic reaction  - Continue avoidance of medications you are reactive too - Let us know which injection you were getting in the back - Will need a vial of Marcaine from your orthopedist in order to do local anesthetic testing (we carry lidocaine in our office)  Follow-up in 4 months or sooner if needed

## 2021-05-22 NOTE — Addendum Note (Signed)
Addended by: Jacqualin Combes on: 05/22/2021 01:53 PM   Modules accepted: Orders

## 2021-05-22 NOTE — Progress Notes (Addendum)
New Patient Note  RE: Tiffany Martin MRN: [id number] DOB: Nov 27, 1960 Date of Office Visit: 05/22/2021  Primary care provider: Libby Maw, MD  Chief Complaint: Allergies  History of present illness: Tiffany Martin is a 61 y.o. female presenting today for evaluation of allergies (environmental, food medication).  She moved to this area from [place] about a 1.5 years ago.  She wants to establish allergy care as she was seeing an allergist there.  She does have more allergy symptoms here than she did in [place].  Since living here she has noted throat clearing, post-nasal drip, nasal congestion, itchy/watery eyes, sneezing.  Symptoms are year-round and worse in the summer.  She takes Allegra daily and does not find it helpful anymore.  She also takes benadryl as needed.  She tried Xyzal and could not tell that it was helpful either.  She has used flonase as needed and does feel it was effective when used.  She never performed immunotherapy while in [place].    She has rashes that she describes as hives.  The rash is red raised bumps that are itchy. she states while in [place] she would get an injection about 3 times a year when she would break out in hives.  In [place] she states she would have hives in summer and around August.  However since she has been here she has noted episodes of hives during April and October.    In [place] she was using a albuterol inhaler when she would go walking or bike riding or if she was exposed to cut grass.  These things would make her short of breath and albuterol use was effective at relieving symptoms.   Of note she also takes hydroxyzine 50 mg at bedtime as directed by her pain clinic doctor, Dr Greta Doom.    She takes omeprazole for reflux control.  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  She states the  smell of pork can cause these symptoms.    Gluten causes her to have cough, diarrhea, nausea, vomiting, abdominal cramps and swelling.  She was diagnosed with Celiac disease in 2019 and is on a gluten free diet.  She avoid dairy to due abdominal cramping and diarrhea.  She will use lactaid pills prior to eating dairy on occasion and helps prevent cramping but still has diarrhea.   She states that NSAIDs cause her to have ischemic colitis thus she no longer takes these.  General medications. Penicillin caused her to develop hive rash. Latex causes a rash. She also reports intolerances to codeine, morphine, aspirin and reports rash.   She had surgery on her knee and states she was getting injections in the knee consisting of Kenalog, lidocaine and Marcaine.  She states the first time she had injection of his medications he did fine.  She states after the injection within an hour she developed headaches, nausea, abdominal pain, diarrhea, sweating.  No rash.  She states these symptoms can last for 3-7 days after.   She was getting steroid injection in her back prior to having the knee procedure without issue but since she started getting the knee injection and having issues with the injections her doctor that manages her back pain does not want to do any further steroid injections until it is sorted out and thus the Kenalog is the issue or not.  Review of systems:  Review of Systems  Constitutional: Negative.   HENT:         See HPI  Eyes:        See HPI  Respiratory: Negative.    Cardiovascular: Negative.   Gastrointestinal: Negative.   Musculoskeletal: Negative.   Skin: Negative.   Allergic/Immunologic: Negative.   Neurological: Negative.    All other systems negative unless noted above in HPI  Past medical history: Past Medical History:  Diagnosis Date   Abdominal hernia    Beta thalassemia (HCC)    GERD (gastroesophageal reflux disease)    High blood pressure    Irritable bowel  syndrome    Ischemic colitis (HCC)    Lactose intolerance     Past surgical history: Past Surgical History:  Procedure Laterality Date   ABDOMINAL HYSTERECTOMY     APPENDECTOMY     CESAREAN SECTION     COLONOSCOPY     KNEE SURGERY Right    x 3   KNEE SURGERY Left    x 2   ROTATOR CUFF REPAIR Left    SHOULDER SURGERY Bilateral    for thoracic outlet syndrome   TONSILLECTOMY     UPPER GASTROINTESTINAL ENDOSCOPY      Family history:  Family History  Problem Relation Age of Onset   Cirrhosis Father        alcohol related   Liver disease Sister        liver failure   Colon cancer Neg Hx    Esophageal cancer Neg Hx    Stomach cancer Neg Hx    Pancreatic cancer Neg Hx    Rectal cancer Neg Hx     Social history: She lives in a home without carpeting with electric heating and central cooling.  No pets in the home.  No concern for water damage, mildew or roaches in the home.  She is retired/a Geophysicist/field seismologist.  She denies a smoking history   Medication List: Current Outpatient Medications  Medication Sig Dispense Refill   acetaminophen (TYLENOL) 500 MG tablet Take 1,000 mg by mouth every 8 (eight) hours as needed.     amlodipine-olmesartan (AZOR) 10-20 MG tablet TAKE 1 TABLET BY MOUTH DAILY 90 tablet 1   aspirin 81 MG chewable tablet Chew 1 tablet by mouth daily.     B Complex-Biotin-FA (B-100 COMPLEX PO) Take 1 capsule by mouth daily.     carvedilol (COREG) 25 MG tablet Take 1 tablet (25 mg total) by mouth 2 (two) times daily. 180 tablet 2   Cholecalciferol (VITAMIN D3) 25 MCG (1000 UT) CAPS Take 1 capsule by mouth daily.     DIGESTIVE ENZYMES PO Take 1 capsule by mouth 2 (two) times daily with a meal.     DULoxetine (CYMBALTA) 60 MG capsule Take 1 capsule (60 mg total) by mouth daily. (Patient taking differently: Take 60 mg by mouth daily. Patient taking 90 mg daily) 90 capsule 1   ferrous sulfate 325 (65 FE) MG tablet Take 1 tablet by mouth daily.     fexofenadine (ALLEGRA)  180 MG tablet Take 1 tablet (180 mg total) by mouth daily. 90 tablet 3   fluticasone (FLONASE) 50 MCG/ACT nasal spray Place into the nose.     gabapentin (NEURONTIN) 600 MG tablet Take 1 tablet (600 mg total) by mouth 3 (three) times daily. 270 tablet 1   nitroGLYCERIN (NITROSTAT) 0.4 MG SL tablet Place under the tongue as needed.     Omeprazole 20 MG TBEC Take 1 tablet by  mouth daily.     ondansetron (ZOFRAN) 4 MG tablet Take 1 tablet (4 mg total) by mouth every 8 (eight) hours as needed. 20 tablet 2   SUMAtriptan (IMITREX) 50 MG tablet Take 1 tablet (50 mg total) by mouth every 2 (two) hours as needed for migraine. May repeat once in 2 hours if headache persists or recurs. 10 tablet 2   tiZANidine (ZANAFLEX) 4 MG tablet Take 4 mg by mouth 3 (three) times daily as needed.     traMADol (ULTRAM) 50 MG tablet Take 2 tablets (100 mg total) by mouth every 8 (eight) hours as needed. 90 tablet 0   vitamin C (ASCORBIC ACID) 500 MG tablet Take 500 mg by mouth daily.     benzonatate (TESSALON) 200 MG capsule Take 200 mg by mouth 3 (three) times daily. (Patient not taking: Reported on 05/22/2021)     No current facility-administered medications for this visit.    Known medication allergies: Allergies  Allergen Reactions   Pork-Derived Products Anaphylaxis   Codeine Nausea And Vomiting    documented per [facility] paper chart/ EMR    Morphine Nausea And Vomiting   Penicillins Hives and Rash   Aspirin    Baclofen    Gluten Meal Cough, Diarrhea, Nausea And Vomiting and Swelling   Nsaids    Latex Rash     Physical examination: Blood pressure 128/84, pulse 98, temperature 97.9 F (36.6 C), resp. rate 18, height 5\' 6"  (1.676 m), weight 213 lb (96.6 kg), SpO2 96 %.  General: Alert, interactive, throat clearing consistently throughout visit. HEENT: PERRLA, TMs pearly gray, turbinates moderately edematous without discharge, post-pharynx non erythematous. Neck: Supple without  lymphadenopathy. Lungs: Clear to auscultation without wheezing, rhonchi or rales. {no increased work of breathing. CV: Normal S1, S2 without murmurs. Abdomen: Nondistended, nontender. Skin: Warm and dry, without lesions or rashes. Extremities:  No clubbing, cyanosis or edema. Neuro:   Grossly intact.  Diagnositics/Labs: Spirometry: FEV1: 1.66L 78%, FVC: 1.79L 66% predicted.  Status post albuterol she had a 50% increase in FVC to 2.05 L or 76%.  This is now basically normalized her spirometry  Allergy testing:   Airborne Adult Perc - 05/22/21 1002     Time Antigen Placed 1002    Allergen Manufacturer [facility]    Location Back    Number of Test 59    Panel 1 Select    2. Control-Histamine 1 mg/ml 2+    4. Crane 2+    5. [place] Negative    6. Johnson Negative    7. Pascola Blue Negative    8. Meadow Fescue Negative    9. Perennial Rye Negative    10. Sweet Vernal Negative    11. Timothy Negative    12. Cocklebur Negative    13. Burweed Marshelder Negative    14. Ragweed, short Negative    15. Ragweed, Giant Negative    16. Plantain,  English Negative    17. Lamb's Quarters Negative    18. Sheep Sorrell Negative    19. Rough Pigweed Negative    20. Marsh Elder, Rough Negative    21. Mugwort, Common Negative    22. Ash mix Negative    23. Birch mix Negative    24. Beech American Negative    25. Box, Elder Negative    26. Cedar, red Negative    27. Cottonwood, [place] Negative    28. Elm mix Negative    29. Hickory Negative    30. Maple mix  Negative    31. Oak, [place] mix Negative    32. Pecan Pollen Negative    33. Pine mix Negative    34. Sycamore Eastern Negative    35. Blaine, Black Pollen Negative    36. Alternaria alternata Negative    37. Cladosporium Herbarum Negative    38. Aspergillus mix Negative    39. Penicillium mix Negative    40. Bipolaris sorokiniana (Helminthosporium) Negative    41. Drechslera spicifera (Curvularia) Negative    42. Mucor plumbeus  Negative    43. Fusarium moniliforme Negative    44. Aureobasidium pullulans (pullulara) Negative    45. Rhizopus oryzae Negative    46. Botrytis cinera Negative    47. Epicoccum nigrum Negative    48. Phoma betae Negative    49. Candida Albicans Negative    50. Trichophyton mentagrophytes Negative    51. Mite, D Farinae  5,000 AU/ml Negative    52. Mite, D Pteronyssinus  5,000 AU/ml Negative    53. Cat Hair 10,000 BAU/ml Negative    54.  Dog Epithelia Negative    55. Mixed Feathers Negative    56. Horse Epithelia Negative    57. Cockroach, German Negative    58. Mouse Negative    59. Tobacco Leaf Negative             Food Adult Perc - 05/22/21 1000     Time Antigen Placed 1003    Allergen Manufacturer Lavella Hammock    Location Back    Number of allergen test 3    3. Wheat Negative    5. Milk, cow Negative    37. Pork Negative             Allergy testing results were read and interpreted by provider, documented by clinical staff.   Assessment and plan: Reactive airway (activity driven)  - Lung function testing today - Have access to albuterol inhaler 2 puffs every 4-6 hours as needed for cough/wheeze/shortness of breath/chest tightness.  May use 15-20 minutes prior to activity.   Monitor frequency of use.   - Use with spacer device  Allergic rhinitis with conjunctivitis - Testing today showed: grasses. - Will obtain environmental allergy panel via bloodwork to see if you have any other allergens - Copy of test results provided.  - Avoidance measures provided. - Stop taking: Allegra and Xyzal as not effective.   Hold flonase as well have you try a combination nasal spray as below - Start taking:Ryaltris (olopatadine/mometasone) two sprays per nostril 1-2 times daily as needed.  This has an nasal steroid (like flonase) and nasal antihistamine to help with congestion and drainage control. Olopatadine 0.2% 1 drop each eye daily as needed for itchy/watery eyes Ryvent 6mg  1  tab twice a day as needed for allergy symptom control.   This is an antihistamine that may work better than Xyzal and Medtronic can use an extra dose of the antihistamine, if needed, for breakthrough symptoms.  - Consider nasal saline rinses 1-2 times daily to remove allergens from the nasal cavities as well as help with mucous clearance (this is especially helpful to do before the nasal sprays are given)  Adverse food reaction - Continue avoidance of pork product, gluten and dairy (avoid dairy due to lactose intolerance) - Have access to self-injectable epinephrine (Epipen or AuviQ) 0.3mg  at all times - Follow emergency action plan in case of allergic reaction  Adverse medication effect - Continue avoidance of medications you are reactive  too - Let us know which injection you were getting in the back - Will need a vial of Marcaine from your orthopedist in order to do local anesthetic testing (we carry lidocaine in our office)  Follow-up in 4 months or sooner if needed  I appreciate the opportunity to take part in Mykah's care. Please do not hesitate to contact me with questions.  Sincerely,   Prudy Feeler, MD Allergy/Immunology [facility]

## 2021-05-27 ENCOUNTER — Other Ambulatory Visit: Payer: Self-pay | Admitting: Allergy

## 2021-05-27 ENCOUNTER — Other Ambulatory Visit: Payer: Self-pay | Admitting: *Deleted

## 2021-05-27 ENCOUNTER — Telehealth: Payer: Self-pay | Admitting: Allergy

## 2021-05-27 MED ORDER — ALBUTEROL SULFATE HFA 108 (90 BASE) MCG/ACT IN AERS: 2.0000 | INHALATION_SPRAY | Freq: Four times a day (QID) | RESPIRATORY_TRACT | 1 refills | Status: DC | PRN

## 2021-05-27 NOTE — Telephone Encounter (Signed)
Prescription has been sent in to pharmacy. Called patient and advised, patient verbalized understanding.

## 2021-05-27 NOTE — Telephone Encounter (Signed)
Patient called and needs to have albuterol inhaler called into [facility] on [address] [phone]

## 2021-05-28 ENCOUNTER — Other Ambulatory Visit: Payer: Self-pay | Admitting: *Deleted

## 2021-05-28 LAB — ALLERGEN, PORK, F26: Pork IgE: <0.1 kU/L

## 2021-05-28 LAB — ALLERGENS W/TOTAL IGE AREA 2

## 2021-05-28 LAB — ALLERGEN MILK: Milk IgE: <0.1 kU/L

## 2021-05-28 LAB — ALLERGEN, WHEAT, F4: Wheat IgE: <0.1 kU/L

## 2021-05-28 MED ORDER — RYALTRIS 665-25 MCG/ACT NA SUSP: 2.0000 | Freq: Two times a day (BID) | NASAL | 2 refills | Status: DC

## 2021-06-01 ENCOUNTER — Other Ambulatory Visit: Payer: Self-pay

## 2021-06-01 ENCOUNTER — Ambulatory Visit: Payer: Federal, State, Local not specified - PPO | Admitting: Family Medicine

## 2021-06-01 ENCOUNTER — Encounter: Payer: Self-pay | Admitting: Family Medicine

## 2021-06-01 VITALS — BP 122/68 | HR 81 | Temp 97.1°F | Ht 66.0 in | Wt 207.6 lb

## 2021-06-01 DIAGNOSIS — R7309 Other abnormal glucose: Secondary | ICD-10-CM

## 2021-06-01 DIAGNOSIS — E559 Vitamin D deficiency, unspecified: Secondary | ICD-10-CM | POA: Diagnosis not present

## 2021-06-01 DIAGNOSIS — Z0000 Encounter for general adult medical examination without abnormal findings: Secondary | ICD-10-CM | POA: Diagnosis not present

## 2021-06-01 DIAGNOSIS — D509 Iron deficiency anemia, unspecified: Secondary | ICD-10-CM

## 2021-06-01 DIAGNOSIS — R002 Palpitations: Secondary | ICD-10-CM | POA: Insufficient documentation

## 2021-06-01 DIAGNOSIS — I10 Essential (primary) hypertension: Secondary | ICD-10-CM | POA: Diagnosis not present

## 2021-06-01 DIAGNOSIS — I87309 Chronic venous hypertension (idiopathic) without complications of unspecified lower extremity: Secondary | ICD-10-CM | POA: Insufficient documentation

## 2021-06-01 DIAGNOSIS — E611 Iron deficiency: Secondary | ICD-10-CM

## 2021-06-01 DIAGNOSIS — D561 Beta thalassemia: Secondary | ICD-10-CM

## 2021-06-01 LAB — URINALYSIS, ROUTINE W REFLEX MICROSCOPIC
Bilirubin Urine: NEGATIVE
Hgb urine dipstick: NEGATIVE
Nitrite: NEGATIVE
Specific Gravity, Urine: 1.025 (ref 1.000–1.030)
Urine Glucose: NEGATIVE
Urobilinogen, UA: 0.2 (ref 0.0–1.0)
pH: 5 (ref 5.0–8.0)

## 2021-06-01 LAB — BASIC METABOLIC PANEL
BUN: 11 mg/dL (ref 6–23)
CO2: 29 mEq/L (ref 19–32)
Calcium: 9.9 mg/dL (ref 8.4–10.5)
Chloride: 102 mEq/L (ref 96–112)
Creatinine, Ser: 0.94 mg/dL (ref 0.40–1.20)
GFR: 65.14 mL/min (ref >60.00)
Glucose, Bld: 116 mg/dL — ABNORMAL HIGH (ref 70–99)
Potassium: 4.1 mEq/L (ref 3.5–5.1)
Sodium: 138 mEq/L (ref 135–145)

## 2021-06-01 LAB — CBC
HCT: 36.8 % (ref 36.0–46.0)
Hemoglobin: 11.8 g/dL — ABNORMAL LOW (ref 12.0–15.0)
MCHC: 32.2 g/dL (ref 30.0–36.0)
MCV: 69.9 fl — ABNORMAL LOW (ref 78.0–100.0)
Platelets: 368 10*3/uL (ref 150.0–400.0)
RBC: 5.27 Mil/uL — ABNORMAL HIGH (ref 3.87–5.11)
RDW: 17.6 % — ABNORMAL HIGH (ref 11.5–15.5)
WBC: 6.2 10*3/uL (ref 4.0–10.5)

## 2021-06-01 LAB — HEMOGLOBIN A1C: Hgb A1c MFr Bld: 5.9 % (ref 4.6–6.5)

## 2021-06-01 NOTE — Progress Notes (Addendum)
Established Patient Office Visit  Subjective:  Patient ID: Tiffany Martin, female    DOB: Oct 27, 1960  Age: 61 y.o. MRN: [id number]  CC:  Chief Complaint  Patient presents with   Follow-up    6 month follow no concerns. Patient not fasting.     HPI Tiffany Martin presents for follow-up of hypertension, elevated glucose, microcytic anemia with low iron and thalassemia.  Seeing nutritionist and has been able to lose some weight.  Knee is doing better after surgery but she is limited to walking and yoga.    Past Medical History:  Diagnosis Date   Abdominal hernia    Beta thalassemia (HCC)    GERD (gastroesophageal reflux disease)    High blood pressure    Irritable bowel syndrome    Ischemic colitis (HCC)    Lactose intolerance     Past Surgical History:  Procedure Laterality Date   ABDOMINAL HYSTERECTOMY     APPENDECTOMY     CESAREAN SECTION     COLONOSCOPY     KNEE SURGERY Right    x 3   KNEE SURGERY Left    x 2   ROTATOR CUFF REPAIR Left    SHOULDER SURGERY Bilateral    for thoracic outlet syndrome   TONSILLECTOMY     UPPER GASTROINTESTINAL ENDOSCOPY      Family History  Problem Relation Age of Onset   Cirrhosis Father        alcohol related   Liver disease Sister        liver failure   Colon cancer Neg Hx    Esophageal cancer Neg Hx    Stomach cancer Neg Hx    Pancreatic cancer Neg Hx    Rectal cancer Neg Hx     Social History   Socioeconomic History   Marital status: Divorced    Spouse name: Not on file   Number of children: Not on file   Years of education: Not on file   Highest education level: Not on file  Occupational History   Not on file  Tobacco Use   Smoking status: Never   Smokeless tobacco: Never  Vaping Use   Vaping Use: Never used  Substance and Sexual Activity   Alcohol use: Never   Drug use: Never   Sexual activity: Not on file  Other Topics Concern   Not on file  Social History Narrative   Not on file   Social  Determinants of Health   Financial Resource Strain: Not on file  Food Insecurity: Not on file  Transportation Needs: Not on file  Physical Activity: Not on file  Stress: Not on file  Social Connections: Not on file  Intimate Partner Violence: Not on file    Outpatient Medications Prior to Visit  Medication Sig Dispense Refill   acetaminophen (TYLENOL) 500 MG tablet Take 1,000 mg by mouth every 8 (eight) hours as needed.     albuterol (VENTOLIN HFA) 108 (90 Base) MCG/ACT inhaler INHALE 2 PUFFS INTO THE LUNGS EVERY 6 HOURS AS NEEDED FOR WHEEZING OR SHORTNESS OF BREATH 54 g 1   amlodipine-olmesartan (AZOR) 10-20 MG tablet TAKE 1 TABLET BY MOUTH DAILY 90 tablet 1   aspirin 81 MG chewable tablet Chew 1 tablet by mouth daily.     B Complex-Biotin-FA (B-100 COMPLEX PO) Take 1 capsule by mouth daily.     carvedilol (COREG) 25 MG tablet Take 1 tablet (25 mg total) by mouth 2 (two) times daily. 180 tablet 2  Cholecalciferol (VITAMIN D3) 25 MCG (1000 UT) CAPS Take 1 capsule by mouth daily.     DIGESTIVE ENZYMES PO Take 1 capsule by mouth 2 (two) times daily with a meal.     DULoxetine (CYMBALTA) 60 MG capsule Take 1 capsule (60 mg total) by mouth daily. (Patient taking differently: Take 60 mg by mouth daily. Patient taking 90 mg daily) 90 capsule 1   ferrous sulfate 325 (65 FE) MG tablet Take 1 tablet by mouth daily.     gabapentin (NEURONTIN) 600 MG tablet Take 1 tablet (600 mg total) by mouth 3 (three) times daily. 270 tablet 1   Olopatadine HCl 0.2 % SOLN Place 1 drop into each eye daily as needed for itchy, watery eyes. 2.5 mL 1   Omeprazole 20 MG TBEC Take 1 tablet by mouth daily.     ondansetron (ZOFRAN) 4 MG tablet Take 1 tablet (4 mg total) by mouth every 8 (eight) hours as needed. 20 tablet 2   RYALTRIS 665-25 MCG/ACT SUSP Place 2 sprays into the nose 2 (two) times daily. 29 g 2   SUMAtriptan (IMITREX) 50 MG tablet Take 1 tablet (50 mg total) by mouth every 2 (two) hours as needed for  migraine. May repeat once in 2 hours if headache persists or recurs. 10 tablet 2   tiZANidine (ZANAFLEX) 4 MG tablet Take 4 mg by mouth 3 (three) times daily as needed.     traMADol (ULTRAM) 50 MG tablet Take 2 tablets (100 mg total) by mouth every 8 (eight) hours as needed. 90 tablet 0   vitamin C (ASCORBIC ACID) 500 MG tablet Take 500 mg by mouth daily.     nitroGLYCERIN (NITROSTAT) 0.4 MG SL tablet Place under the tongue as needed.     Carbinoxamine Maleate (RYVENT) 6 MG TABS Take 1 tablet by mouth 2 (two) times daily as needed. 120 tablet 1   EPINEPHrine 0.3 mg/0.3 mL IJ SOAJ injection Inject into the muscle.     Olopatadine-Mometasone 712-45 MCG/ACT SUSP Place 2 sprays into the nose as needed. Place 2 (two) sprays per nostril 1-2 times daily as needed. 29 g 1   benzonatate (TESSALON) 200 MG capsule Take 200 mg by mouth 3 (three) times daily. (Patient not taking: Reported on 05/22/2021)     fexofenadine (ALLEGRA) 180 MG tablet Take 1 tablet (180 mg total) by mouth daily. (Patient not taking: Reported on 06/01/2021) 90 tablet 3   fluticasone (FLONASE) 50 MCG/ACT nasal spray Place into the nose. (Patient not taking: Reported on 06/01/2021)     No facility-administered medications prior to visit.    Allergies  Allergen Reactions   Pork-Derived Products Anaphylaxis   Codeine Nausea And Vomiting    documented per [facility] paper chart/ EMR    Morphine Nausea And Vomiting   Penicillins Hives and Rash   Aspirin    Baclofen    Gluten Meal Cough, Diarrhea, Nausea And Vomiting and Swelling   Nsaids    Latex Rash    ROS Review of Systems  Constitutional:  Negative for diaphoresis, fatigue, fever and unexpected weight change.  HENT: Negative.    Eyes:  Negative for photophobia and visual disturbance.  Respiratory: Negative.    Cardiovascular: Negative.   Gastrointestinal: Negative.   Endocrine: Negative for polyphagia and polyuria.  Musculoskeletal:  Negative for arthralgias.  Neurological:   Negative for speech difficulty and weakness.     Objective:    Physical Exam Vitals and nursing note reviewed.  Constitutional:  General: She is not in acute distress.    Appearance: Normal appearance. She is not ill-appearing, toxic-appearing or diaphoretic.  HENT:     Head: Normocephalic and atraumatic.     Right Ear: External ear normal.     Left Ear: External ear normal.     Mouth/Throat:     Mouth: Mucous membranes are moist.     Pharynx: Oropharynx is clear. No oropharyngeal exudate or posterior oropharyngeal erythema.  Eyes:     General: No scleral icterus.       Right eye: No discharge.        Left eye: No discharge.     Extraocular Movements: Extraocular movements intact.     Conjunctiva/sclera: Conjunctivae normal.     Pupils: Pupils are equal, round, and reactive to light.  Cardiovascular:     Rate and Rhythm: Normal rate and regular rhythm.  Pulmonary:     Effort: Pulmonary effort is normal.     Breath sounds: Normal breath sounds.  Abdominal:     General: Bowel sounds are normal.  Musculoskeletal:     Cervical back: No rigidity or tenderness.  Lymphadenopathy:     Cervical: No cervical adenopathy.  Skin:    General: Skin is warm and dry.  Neurological:     Mental Status: She is alert and oriented to person, place, and time.  Psychiatric:        Mood and Affect: Mood normal.        Behavior: Behavior normal.    BP 122/68 (BP Location: Right Leg, Patient Position: Sitting, Cuff Size: Large)    Pulse 81    Temp (!) 97.1 F (36.2 C) (Temporal)    Ht 5\' 6"  (1.676 m)    Wt 207 lb 9.6 oz (94.2 kg)    SpO2 97%    BMI 33.51 kg/m  Wt Readings from Last 3 Encounters:  06/01/21 207 lb 9.6 oz (94.2 kg)  05/22/21 213 lb (96.6 kg)  11/28/20 212 lb (96.2 kg)     Health Maintenance Due  Topic Date Due   HIV Screening  Never done   Hepatitis C Screening  Never done   MAMMOGRAM  11/15/2020    There are no preventive care reminders to display for this  patient.  Lab Results  Component Value Date   TSH 1.20 03/05/2019   Lab Results  Component Value Date   WBC 6.5 11/28/2020   HGB 11.4 (L) 11/28/2020   HCT 35.6 (L) 11/28/2020   MCV 72.8 (L) 11/28/2020   PLT 373.0 11/28/2020   Lab Results  Component Value Date   NA 144 11/28/2020   K 4.5 11/28/2020   CO2 28 11/28/2020   GLUCOSE 100 (H) 11/28/2020   BUN 11 11/28/2020   CREATININE 0.84 11/28/2020   BILITOT 0.5 12/17/2019   ALKPHOS 83 12/17/2019   AST 16 12/17/2019   ALT 16 12/17/2019   PROT 7.6 12/17/2019   ALBUMIN 4.7 12/17/2019   CALCIUM 10.1 11/28/2020   GFR 74.82 11/28/2020   Lab Results  Component Value Date   CHOL 157 03/20/2019   Lab Results  Component Value Date   HDL 47.70 03/20/2019   Lab Results  Component Value Date   LDLCALC 78 03/20/2019   Lab Results  Component Value Date   TRIG 160.0 (H) 03/20/2019   Lab Results  Component Value Date   CHOLHDL 3 03/20/2019   Lab Results  Component Value Date   HGBA1C 5.9 11/28/2020  Assessment & Plan:   Problem List Items Addressed This Visit       Cardiovascular and Mediastinum   Essential hypertension   Relevant Medications   EPINEPHrine 0.3 mg/0.3 mL IJ SOAJ injection   Other Relevant Orders   Basic Metabolic Panel (BMET)     Other   Vitamin D deficiency   Relevant Orders   VITAMIN D 25 Hydroxy (Vit-D Deficiency, Fractures)   Iron deficiency   Relevant Orders   Iron, TIBC and Ferritin Panel   Microcytic anemia   Relevant Orders   CBC   Iron, TIBC and Ferritin Panel   Beta thalassemia (HCC)   Elevated glucose   Relevant Orders   Basic Metabolic Panel (BMET)   Hemoglobin A1c   Other Visit Diagnoses     Healthcare maintenance    -  Primary   Relevant Orders   CBC   Urinalysis, Routine w reflex microscopic   Basic Metabolic Panel (BMET)       No orders of the defined types were placed in this encounter.   Follow-up: Return in about 6 months (around 11/29/2021), or  return fasting at next appointment for lipid check..  Continue weight loss efforts and exercise.  Libby Maw, MD

## 2021-06-02 DIAGNOSIS — Z713 Dietary counseling and surveillance: Secondary | ICD-10-CM | POA: Diagnosis not present

## 2021-06-02 LAB — IRON,TIBC AND FERRITIN PANEL
%SAT: 28 % (calc) (ref 16–45)
Ferritin: 44 ng/mL (ref 16–288)
Iron: 73 ug/dL (ref 45–160)
TIBC: 260 mcg/dL (calc) (ref 250–450)

## 2021-06-02 LAB — VITAMIN D 25 HYDROXY (VIT D DEFICIENCY, FRACTURES): VITD: 38.27 ng/mL (ref 30.00–100.00)

## 2021-06-02 NOTE — Addendum Note (Signed)
Addended by: Theresia Lo on: 06/02/2021 08:30 AM   Modules accepted: Level of Service

## 2021-06-03 ENCOUNTER — Ambulatory Visit
Admission: RE | Admit: 2021-06-03 | Discharge: 2021-06-03 | Disposition: A | Discharge status: Home / Self Care | Payer: Federal, State, Local not specified - PPO | Source: Ambulatory Visit | Attending: Family Medicine | Admitting: Family Medicine

## 2021-06-03 DIAGNOSIS — Z13820 Encounter for screening for osteoporosis: Secondary | ICD-10-CM

## 2021-06-03 DIAGNOSIS — Z1231 Encounter for screening mammogram for malignant neoplasm of breast: Secondary | ICD-10-CM

## 2021-06-03 DIAGNOSIS — Z780 Asymptomatic menopausal state: Secondary | ICD-10-CM | POA: Diagnosis not present

## 2021-06-09 DIAGNOSIS — G894 Chronic pain syndrome: Secondary | ICD-10-CM | POA: Diagnosis not present

## 2021-06-09 DIAGNOSIS — Z79891 Long term (current) use of opiate analgesic: Secondary | ICD-10-CM | POA: Diagnosis not present

## 2021-06-09 DIAGNOSIS — M5416 Radiculopathy, lumbar region: Secondary | ICD-10-CM | POA: Diagnosis not present

## 2021-06-09 DIAGNOSIS — M47812 Spondylosis without myelopathy or radiculopathy, cervical region: Secondary | ICD-10-CM | POA: Diagnosis not present

## 2021-06-09 DIAGNOSIS — M47816 Spondylosis without myelopathy or radiculopathy, lumbar region: Secondary | ICD-10-CM | POA: Diagnosis not present

## 2021-06-11 ENCOUNTER — Encounter: Payer: Self-pay | Admitting: Allergy

## 2021-06-11 ENCOUNTER — Other Ambulatory Visit: Payer: Self-pay | Admitting: *Deleted

## 2021-06-12 ENCOUNTER — Telehealth: Payer: Self-pay | Admitting: *Deleted

## 2021-06-12 NOTE — Telephone Encounter (Signed)
PA has been submitted through CoverMyMeds for Carbinoxamine '6mg'$  and is currently pending approval/denial.  ?

## 2021-06-14 ENCOUNTER — Other Ambulatory Visit: Payer: Self-pay | Admitting: Family Medicine

## 2021-06-14 DIAGNOSIS — I10 Essential (primary) hypertension: Secondary | ICD-10-CM

## 2021-06-15 NOTE — Telephone Encounter (Signed)
Insurance responded that the PA has been resolved and that no PA is required for generic Ryvent, Carbinoxamine.  ?

## 2021-06-19 DIAGNOSIS — Z713 Dietary counseling and surveillance: Secondary | ICD-10-CM | POA: Diagnosis not present

## 2021-06-26 DIAGNOSIS — Z713 Dietary counseling and surveillance: Secondary | ICD-10-CM | POA: Diagnosis not present

## 2021-07-06 ENCOUNTER — Ambulatory Visit: Payer: Federal, State, Local not specified - PPO | Admitting: Family

## 2021-07-07 DIAGNOSIS — M47812 Spondylosis without myelopathy or radiculopathy, cervical region: Secondary | ICD-10-CM | POA: Diagnosis not present

## 2021-07-07 DIAGNOSIS — M47816 Spondylosis without myelopathy or radiculopathy, lumbar region: Secondary | ICD-10-CM | POA: Diagnosis not present

## 2021-07-07 DIAGNOSIS — G894 Chronic pain syndrome: Secondary | ICD-10-CM | POA: Diagnosis not present

## 2021-07-07 DIAGNOSIS — M5416 Radiculopathy, lumbar region: Secondary | ICD-10-CM | POA: Diagnosis not present

## 2021-07-08 ENCOUNTER — Other Ambulatory Visit: Payer: Self-pay

## 2021-07-08 ENCOUNTER — Encounter: Payer: Self-pay | Admitting: Family Medicine

## 2021-07-08 DIAGNOSIS — Z8669 Personal history of other diseases of the nervous system and sense organs: Secondary | ICD-10-CM

## 2021-07-08 MED ORDER — ONDANSETRON HCL 4 MG PO TABS: 4.0000 mg | ORAL_TABLET | Freq: Three times a day (TID) | ORAL | 2 refills | Status: DC | PRN

## 2021-07-13 ENCOUNTER — Encounter: Payer: Self-pay | Admitting: Family Medicine

## 2021-07-13 DIAGNOSIS — Z8669 Personal history of other diseases of the nervous system and sense organs: Secondary | ICD-10-CM

## 2021-07-13 MED ORDER — SUMATRIPTAN SUCCINATE 50 MG PO TABS: 50.0000 mg | ORAL_TABLET | ORAL | 2 refills | Status: AC | PRN

## 2021-07-16 ENCOUNTER — Ambulatory Visit: Payer: Federal, State, Local not specified - PPO | Admitting: Family

## 2021-07-24 DIAGNOSIS — Z713 Dietary counseling and surveillance: Secondary | ICD-10-CM | POA: Diagnosis not present

## 2021-08-04 DIAGNOSIS — M47812 Spondylosis without myelopathy or radiculopathy, cervical region: Secondary | ICD-10-CM | POA: Diagnosis not present

## 2021-08-04 DIAGNOSIS — M47816 Spondylosis without myelopathy or radiculopathy, lumbar region: Secondary | ICD-10-CM | POA: Diagnosis not present

## 2021-08-04 DIAGNOSIS — M5416 Radiculopathy, lumbar region: Secondary | ICD-10-CM | POA: Diagnosis not present

## 2021-08-04 DIAGNOSIS — G894 Chronic pain syndrome: Secondary | ICD-10-CM | POA: Diagnosis not present

## 2021-08-21 DIAGNOSIS — Z713 Dietary counseling and surveillance: Secondary | ICD-10-CM | POA: Diagnosis not present

## 2021-08-24 ENCOUNTER — Encounter: Payer: Self-pay | Admitting: Family

## 2021-08-24 ENCOUNTER — Ambulatory Visit: Payer: Federal, State, Local not specified - PPO | Admitting: Family

## 2021-08-24 ENCOUNTER — Other Ambulatory Visit: Payer: Federal, State, Local not specified - PPO

## 2021-08-24 VITALS — BP 138/80 | HR 74 | Temp 98.2°F | Resp 20 | Ht 65.75 in | Wt 209.6 lb

## 2021-08-24 DIAGNOSIS — T781XXD Other adverse food reactions, not elsewhere classified, subsequent encounter: Secondary | ICD-10-CM

## 2021-08-24 DIAGNOSIS — J4520 Mild intermittent asthma, uncomplicated: Secondary | ICD-10-CM

## 2021-08-24 DIAGNOSIS — R0609 Other forms of dyspnea: Secondary | ICD-10-CM

## 2021-08-24 DIAGNOSIS — T50905D Adverse effect of unspecified drugs, medicaments and biological substances, subsequent encounter: Secondary | ICD-10-CM

## 2021-08-24 DIAGNOSIS — J3089 Other allergic rhinitis: Secondary | ICD-10-CM

## 2021-08-24 DIAGNOSIS — H1013 Acute atopic conjunctivitis, bilateral: Secondary | ICD-10-CM

## 2021-08-24 MED ORDER — CARBINOXAMINE MALEATE 6 MG PO TABS: ORAL_TABLET | ORAL | 1 refills | Status: DC

## 2021-08-24 NOTE — Progress Notes (Addendum)
[place] [place] [id number] Dept: [phone]  FOLLOW UP NOTE  Patient ID: Tiffany Martin, female    DOB: 01-10-1961  Age: 61 y.o. MRN: [id number] Date of Office Visit: 08/24/2021  Assessment  Chief Complaint: Allergy Testing (Medications)  HPI Tiffany Martin is a 61 year old female who presents today for skin testing to methylprednisolone and lidocaine.  She was last seen on May 22, 2021 by Dr. Nelva Bush for reactive airway disease (activity driven), allergic rhinitis with conjunctivitis, adverse food reaction, and adverse medication with food.  She denies any new diagnosis or surgeries since we last saw her.  She reports that in the past she was getting injections in the knee consisting of Kenalog, lidocaine, and Marcaine.  With the first injection she did fine.  After that she started noticing gradual symptoms with continued injections.  After one of the injection she noticed within an hour that she developed headache, nausea, abdominal pain, diarrhea, and sweating.  She did not develop a rash.  Her symptoms would last for approximately 3 to 7 days.  She also reports that she was getting injections in her back, but her doctor will not give any more of these until her testing is completed.  She has been off all antihistamines for the past 3 days. She also has not been using her albuterol or nasal spray for the past 3 days because she thought that she could not use them .  Reactive airway disease is reported as not well controlled for the past 3 to 4 weeks.  She reports for the past 3 to 4 weeks she has had wheezing, tightness in her chest, shortness of breath with exertion, and nocturnal awakenings with wheezing and cough.  She reports that she used to have a cough in the past, but none now.  She has been using her albuterol 3 times a day.  The albuterol will help open her up a little.  In the past when she lived in [place] she was seeing a cardiologist for "blockage".  She  has not seen a cardiologist since moving to [place] 2 years ago.  She also reports that her left lower extremity always swells.  She denies chest pain and palpitations.  Allergic rhinitis with conjunctivitis is reported as not well controlled with Xyzal, Benadryl as needed, and Ryaltris nasal spray.  She never heard anything about the RyVent that she was prescribed at the last office visit.  She reports that she has had rhinorrhea that is been sometimes green and sometimes clear for the past couple weeks.  She also at times see a little bit of blood.  Discussed proper technique and it was found that she was not using proper technique with her nasal spray.  Demonstration given on proper technique.  She reports nasal congestion and postnasal drip.  She also will have some sinus pressure and sinus headaches at times.  She has not had any sinus infections since we last saw her.  Allergic conjunctivitis is reported as moderately controlled with olopatadine eyedrops.  She reports occasional itchy watery eyes.  She continues to avoid pork products, gluten, and dairy without any accidental ingestion or use of her epinephrine autoinjector device.  She is not interested in doing an oral challenge to these foods.  She reports that the smell of pork causes her to cough and her tongue will get thick.  Right before moving from [place] she attempted to do a milk challenge with the [employer] and reports  that they had to stop after the second step.     Drug Allergies:  Allergies  Allergen Reactions   Pork-Derived Products Anaphylaxis   Codeine Nausea And Vomiting    documented per [facility] paper chart/ EMR    Morphine Nausea And Vomiting   Penicillins Hives and Rash   Aspirin    Baclofen    Gluten Meal Cough, Diarrhea, Nausea And Vomiting and Swelling   Nsaids    Latex Rash    Review of Systems: Review of Systems  Constitutional:  Negative for chills and fever.  HENT:         Reports  rhinorrhea that can be green at times and at times clear. Also reports post nasal drip and nasal congestion  Eyes:        Reports itchy watery eyes for which she has eye drops to use  Respiratory:  Positive for cough, shortness of breath and wheezing.        Reports cough in the past, but none now. Also reports wheezing at night, shortness of breath with exertion, and tightness in chest. Reports nocturnal awakenings with wheeze. Also, has sleep apnea,but has not been given a machine yet.  Cardiovascular:  Positive for leg swelling. Negative for chest pain and palpitations.       Reports in [place] she used to see a cardiologist for "blockage". Has not seen a cardiologist in [place] since moving here 2 years ago. Reports that she always has left lower extremity swelling  Gastrointestinal:        Reports heartburn and reflux- takes omeprazole once a day  Genitourinary:  Negative for frequency.  Skin:  Positive for itching and rash.       Reports rash on dorsal aspect of right forearm. Rash is getting better with Aveeno lotion.   Neurological:  Positive for headaches.       Reports sinus headaches   Endo/Heme/Allergies:  Positive for environmental allergies.    Physical Exam: BP 138/80   Pulse 74   Temp 98.2 F (36.8 C) (Temporal)   Resp 20   Ht 5' 5.75" (1.67 m)   Wt 209 lb 9.6 oz (95.1 kg)   SpO2 96%   BMI 34.09 kg/m    Physical Exam Constitutional:      Appearance: Normal appearance.  HENT:     Head: Normocephalic and atraumatic.     Comments: Pharynx normal, eyes normal, ears normal, nose: Bilateral lower turbinates mildly edematous and slightly erythematous with clear drainage noted    Right Ear: Tympanic membrane, ear canal and external ear normal.     Left Ear: Tympanic membrane, ear canal and external ear normal.     Mouth/Throat:     Mouth: Mucous membranes are moist.     Pharynx: Oropharynx is clear.  Eyes:     Conjunctiva/sclera: Conjunctivae normal.  Cardiovascular:      Rate and Rhythm: Normal rate and regular rhythm.     Heart sounds: Normal heart sounds.  Pulmonary:     Effort: Pulmonary effort is normal.     Breath sounds: Normal breath sounds.     Comments: Lungs clear to auscultation Musculoskeletal:     Cervical back: Neck supple.  Skin:    General: Skin is warm.     Comments: Flesh colored slightly elevated papular lesions noted on right forearm.  Neurological:     Mental Status: She is alert and oriented to person, place, and time.  Psychiatric:  Mood and Affect: Mood normal.        Behavior: Behavior normal.        Thought Content: Thought content normal.        Judgment: Judgment normal.    Diagnostics: FVC 1.80 L (66%), FEV1 1.60 L (74%).  Predicted FVC 2.74 L, predicted FEV1 2.16 L.  Spirometry indicates possible mild restriction.  Post bronchodilator response shows FVC 1.91 L, FEV1 1.67 L.  There is a 4% change in FEV1.  Spirometry indicates possible mild restriction. She reports feeling some better after 4 puffs of Xopenex  Assessment and Plan: 1. Dyspnea on exertion   2. Mild intermittent reactive airway disease without complication   3. Non-seasonal allergic rhinitis due to other allergic trigger   4. Adverse food reaction, subsequent encounter   5. Allergic conjunctivitis of both eyes   6. Adverse effect of drug, subsequent encounter     Meds ordered this encounter  Medications   Carbinoxamine Maleate (RYVENT) 6 MG TABS    Sig: Take 1 tablet by mouth 2 times a day as needed    Dispense:  60 tablet    Refill:  1    Patient Instructions  Reactive airway disease - Have access to albuterol inhaler 2 puffs every 4-6 hours as needed for cough/wheeze/shortness of breath/chest tightness.  May use 15-20 minutes prior to activity.   Monitor frequency of use.   - Use with spacer device -we will refer you to cardiology due to dyspnea with exertion and reported history of "blockage" -Lets do a 30-day trial of Flovent 110 mcg 2  puffs twice a day with spacer to help prevent cough and wheeze.  Rinse your mouth out afterwards.  Demonstration given.  Allergic rhinitis with conjunctivisits - Testing on 05/22/21 was positive to: grasses and lab work for environmental allergens were negative - Continue taking:Ryaltris (olopatadine/mometasone) two sprays per nostril 1-2 times daily as needed.  This has an nasal steroid (like flonase) and nasal antihistamine to help with congestion and drainage control.In the right nostril, point the applicator out toward the right ear. In the left nostril, point the applicator out toward the left ear Olopatadine 0.2% 1 drop each eye daily as needed for itchy/watery eyes Start Ryvent '6mg'$  1 tab twice a day as needed for allergy symptom control. I will have the nurse find out if it is covered. If Ryvent is covered stop Xyzal and benadryl as needed - Consider nasal saline rinses 1-2 times daily to remove allergens from the nasal cavities as well as help with mucous clearance (this is especially helpful to do before the nasal sprays are given)  Adverse food reaction - Continue avoidance of pork product, gluten and dairy (avoid dairy due to lactose intolerance) - Have access to self-injectable epinephrine (Epipen or AuviQ) 0.'3mg'$  at all times - Follow emergency action plan in case of allergic reaction --IgE to pork, milk and wheat are negative.skin testing on 05/22/21 to wheat, cow's milk, and pork were negative.  She is eligible for in office food challenge to these foods if interested.These  in office challenges would be done on separate days. She would need to be off all antihistamines 3 days prior to the challenge and need to bring the food item with her. Also, she will need to be in good health the day of the challenge. These appointments last approximately 2-3 hours.  Adverse medication effect - Continue avoidance of medications you are reactive too - We will do skin testing to lidocaine  and  methylprednisolone once your breathing gets better. Discussed how the challenges to both of these drugs would need to be done on separate days.  - Please also find out the dosage of methylprednisolone your doctor would like [place] to challenge with.   Follow-up in 4-6 weeks with Dr. Nelva Bush or sooner if needed    Return in about 4 weeks (around 09/21/2021), or if symptoms worsen or fail to improve.    Thank you for the opportunity to care for this patient.  Please do not hesitate to contact me with questions.  Althea Charon, FNP [facility]

## 2021-08-24 NOTE — Patient Instructions (Addendum)
Reactive airway disease - Have access to albuterol inhaler 2 puffs every 4-6 hours as needed for cough/wheeze/shortness of breath/chest tightness.  May use 15-20 minutes prior to activity.   Monitor frequency of use.   - Use with spacer device -we will refer you to cardiology due to dyspnea with exertion and reported history of "blockage" -Lets do a 30-day trial of Flovent 110 mcg 2 puffs twice a day with spacer to help prevent cough and wheeze.  Rinse your mouth out afterwards.  Demonstration given.  Allergic rhinitis with conjunctivisits - Testing on 05/22/21 was positive to: grasses and lab work for environmental allergens were negative - Continue taking:Ryaltris (olopatadine/mometasone) two sprays per nostril 1-2 times daily as needed.  This has an nasal steroid (like flonase) and nasal antihistamine to help with congestion and drainage control.In the right nostril, point the applicator out toward the right ear. In the left nostril, point the applicator out toward the left ear Olopatadine 0.2% 1 drop each eye daily as needed for itchy/watery eyes Start Ryvent '6mg'$  1 tab twice a day as needed for allergy symptom control. I will have the nurse find out if it is covered. If Ryvent is covered stop Xyzal and benadryl as needed - Consider nasal saline rinses 1-2 times daily to remove allergens from the nasal cavities as well as help with mucous clearance (this is especially helpful to do before the nasal sprays are given)  Adverse food reaction - Continue avoidance of pork product, gluten and dairy (avoid dairy due to lactose intolerance) - Have access to self-injectable epinephrine (Epipen or AuviQ) 0.'3mg'$  at all times - Follow emergency action plan in case of allergic reaction --IgE to pork, milk and wheat are negative.skin testing on 05/22/21 to wheat, cow's milk, and pork were negative.  She is eligible for in office food challenge to these foods if interested.These  in office challenges would be  done on separate days. She would need to be off all antihistamines 3 days prior to the challenge and need to bring the food item with her. Also, she will need to be in good health the day of the challenge. These appointments last approximately 2-3 hours.  Adverse medication effect - Continue avoidance of medications you are reactive too - We will do skin testing to lidocaine and methylprednisolone once your breathing gets better. Discussed how the challenges to both of these drugs would need to be done on separate days.  - Please also find out the dosage of methylprednisolone your doctor would like [place] to challenge with.   Follow-up in 4-6 weeks with Dr. Nelva Bush or sooner if needed

## 2021-08-28 ENCOUNTER — Encounter: Payer: Self-pay | Admitting: Gastroenterology

## 2021-08-28 ENCOUNTER — Ambulatory Visit: Payer: Federal, State, Local not specified - PPO | Admitting: Gastroenterology

## 2021-08-28 VITALS — BP 124/80 | HR 80 | Ht 66.0 in | Wt 211.2 lb

## 2021-08-28 DIAGNOSIS — K9041 Non-celiac gluten sensitivity: Secondary | ICD-10-CM | POA: Diagnosis not present

## 2021-08-28 DIAGNOSIS — K5909 Other constipation: Secondary | ICD-10-CM

## 2021-08-28 DIAGNOSIS — K219 Gastro-esophageal reflux disease without esophagitis: Secondary | ICD-10-CM | POA: Diagnosis not present

## 2021-08-28 DIAGNOSIS — R0609 Other forms of dyspnea: Secondary | ICD-10-CM

## 2021-08-28 DIAGNOSIS — Z79899 Other long term (current) drug therapy: Secondary | ICD-10-CM | POA: Diagnosis not present

## 2021-08-28 MED ORDER — POLYETHYLENE GLYCOL 3350 17 G PO PACK: PACK | ORAL | 0 refills | Status: AC

## 2021-08-28 MED ORDER — IBSRELA 50 MG PO TABS: 50.0000 mg | ORAL_TABLET | Freq: Every day | ORAL | 0 refills | Status: DC

## 2021-08-28 NOTE — Patient Instructions (Addendum)
If you are age 63 or older, your body mass index should be between 23-30. Your Body mass index is 34.09 kg/m. If this is out of the aforementioned range listed, please consider follow up with your Primary Care Provider.  If you are age 46 or younger, your body mass index should be between 19-25. Your Body mass index is 34.09 kg/m. If this is out of the aformentioned range listed, please consider follow up with your Primary Care Provider.   ________________________________________________________  The [place] [facility] providers would like to encourage you to use [facility] to communicate with providers for non-urgent requests or questions.  Due to long hold times on the telephone, sending your provider a message by [facility] may be a faster and more efficient way to get a response.  Please allow 48 business hours for a response.  Please remember that this is for non-urgent requests.  _______________________________________________________  See Cardiology. They can be reached at  [phone].  We are giving you a sample of IBSRELA 50 ZP:HXTA once daily  Continue Miralax: Take once to twice a day as needed.  Thank you for entrusting me with your care and for choosing [facility], Dr. [place] Cellar

## 2021-08-28 NOTE — Progress Notes (Signed)
HPI :  61 year old female here for a follow-up visit for altered bowel habits, GERD, reported history of celiac disease, thalassemia.  Recall she had a history of chronic constipation, has been on a variety of regimens to include MiraLAX, Linzess, Motegrity.  At her last visit we offered her a trial of Motegrity but it did not work as well as Market researcher had worked in the past.  Although [place] initially worked well for her but eventually it lost its efficacy.  Most recently she has been using MiraLAX once daily.  She thinks between this and changing her diet she has been doing much better.  She has been seeing a dietitian to help manage some of her bowel symptoms.  She has been avoiding some certain vegetables and cooking all her vegetables and she states that seems to be making a difference including avoiding red meat.  She is not using any NSAIDs.  Recall she had a history of ischemic colitis in years past but nothing recently.  Generally she is doing better with her bowels but still can have some constipation at times.  We discussed options.  Recall otherwise when she lived in [place] about 4 years ago she was told she had celiac disease.  She has never had a prior endoscopy.  She has been on a 100% gluten-free diet since that time.  We had obtained TTG level which was negative.  We had discussed doing an EGD to clarify this history as she has never had an EGD before and in light of her history of GERD.  She was ill at the time of her scheduled endoscopy and it was canceled so she never had it done.  She continues to take omeprazole once daily for her reflux.  She states it works pretty well for her and she is happy with the regimen.  She continues to be gluten-free.  She wants to proceed with an endoscopy at this point time.  Otherwise looking through her chart she has recently seen an allergist, she has reactive airway disease/asthma.  She has PFTs pending with them.  She is on inhalers which are  helping her symptoms.  She states she has been referred to see a cardiologist because she has been having some exertional dyspnea palpitations, as well as some lower leg swelling.  This consultation is pending.  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  She does have a history of ischemic colitis diagnosed in 2010 that occurred in the setting of NSAIDs, potentially thought Celebrex was the cause of that.   Most recent workup: Colonoscopy 07/25/19 - The examined portion of the ileum was normal. - Two 2 to 3 mm polyps in the ascending colon, removed with a cold snare. Resected and Retrieved. - One 3 mm polyp in the transverse colon, removed with a cold snare. Resected and retrieved. - Medium-sized lipoma in the transverse colon. - Tortuous colon. - Diverticulosis in the transverse colon and in the ascending colon. - Internal hemorrhoids. - The examination was otherwise normal. No abnormalities on this exam to account for prior CT changes, which were likely representative of self limited inflammatory process, ? infetious vs. ischemic colitis?   Repeat colonoscopy in 5 years     MRI abdomen 08/01/19 - IMPRESSION: Normal appearance of the pancreatic parenchyma and normal caliber of the pancreatic duct. No evidence of pancreatitis, pancreatic ductal dilatation, or pancreatic fluid collection.   Past Medical History:  Diagnosis  Date   Abdominal hernia    Beta thalassemia (HCC)    GERD (gastroesophageal reflux disease)    High blood pressure    Irritable bowel syndrome    Ischemic colitis ([place])    Lactose intolerance      Past Surgical History:  Procedure Laterality Date   ABDOMINAL HYSTERECTOMY     APPENDECTOMY     CESAREAN SECTION     COLONOSCOPY     KNEE SURGERY Right    x 3   KNEE SURGERY Left    x 2   ROTATOR CUFF REPAIR Left    SHOULDER SURGERY Bilateral    for thoracic outlet syndrome   TONSILLECTOMY     UPPER  GASTROINTESTINAL ENDOSCOPY     Family History  Problem Relation Age of Onset   Cirrhosis Father        alcohol related   Liver disease Sister        liver failure   Breast cancer Maternal Aunt    Colon cancer Neg Hx    Esophageal cancer Neg Hx    Stomach cancer Neg Hx    Pancreatic cancer Neg Hx    Rectal cancer Neg Hx    Social History   Tobacco Use   Smoking status: Never   Smokeless tobacco: Never  Vaping Use   Vaping Use: Never used  Substance Use Topics   Alcohol use: Never   Drug use: Never   Current Outpatient Medications  Medication Sig Dispense Refill   acetaminophen (TYLENOL) 500 MG tablet Take 1,000 mg by mouth every 8 (eight) hours as needed.     albuterol (VENTOLIN HFA) 108 (90 Base) MCG/ACT inhaler INHALE 2 PUFFS INTO THE LUNGS EVERY 6 HOURS AS NEEDED FOR WHEEZING OR SHORTNESS OF BREATH 54 g 1   amlodipine-olmesartan (AZOR) 10-20 MG tablet TAKE 1 TABLET BY MOUTH DAILY 90 tablet 1   aspirin 81 MG chewable tablet Chew 1 tablet by mouth daily.     B Complex-Biotin-FA (B-100 COMPLEX PO) Take 1 capsule by mouth daily.     Carbinoxamine Maleate (RYVENT) 6 MG TABS Take 1 tablet by mouth 2 times a day as needed 60 tablet 1   carvedilol (COREG) 25 MG tablet TAKE 1 TABLET(25 MG) BY MOUTH TWICE DAILY 180 tablet 2   Cholecalciferol (VITAMIN D3) 25 MCG (1000 UT) CAPS Take 1 capsule by mouth daily.     DIGESTIVE ENZYMES PO Take 1 capsule by mouth 2 (two) times daily with a meal.     DULoxetine (CYMBALTA) 60 MG capsule Take 1 capsule (60 mg total) by mouth daily. (Patient taking differently: Take 60 mg by mouth daily. Patient taking 90 mg daily) 90 capsule 1   EPINEPHrine 0.3 mg/0.3 mL IJ SOAJ injection Inject into the muscle.     ferrous sulfate 325 (65 FE) MG tablet Take 1 tablet by mouth daily.     gabapentin (NEURONTIN) 600 MG tablet Take 1 tablet (600 mg total) by mouth 3 (three) times daily. 270 tablet 1   hydrOXYzine (VISTARIL) 25 MG capsule Take 25-50 mg by mouth at  bedtime as needed.     Olopatadine HCl 0.2 % SOLN Place 1 drop into each eye daily as needed for itchy, watery eyes. 2.5 mL 1   Olopatadine-Mometasone 665-25 MCG/ACT SUSP Place 2 sprays into the nose as needed. Place 2 (two) sprays per nostril 1-2 times daily as needed. 29 g 1   Omeprazole 20 MG TBEC Take 1 tablet by mouth daily.  ondansetron (ZOFRAN) 4 MG tablet Take 1 tablet (4 mg total) by mouth every 8 (eight) hours as needed. 20 tablet 2   RYALTRIS 665-25 MCG/ACT SUSP Place 2 sprays into the nose 2 (two) times daily. 29 g 2   SUMAtriptan (IMITREX) 50 MG tablet Take 1 tablet (50 mg total) by mouth every 2 (two) hours as needed for migraine. May repeat once in 2 hours if headache persists or recurs. 10 tablet 2   tiZANidine (ZANAFLEX) 4 MG tablet Take 4 mg by mouth 3 (three) times daily as needed.     traMADol (ULTRAM) 50 MG tablet Take 2 tablets (100 mg total) by mouth every 8 (eight) hours as needed. 90 tablet 0   vitamin C (ASCORBIC ACID) 500 MG tablet Take 500 mg by mouth daily.     No current facility-administered medications for this visit.   Allergies  Allergen Reactions   Pork-Derived Products Anaphylaxis   Codeine Nausea And Vomiting    documented per [facility] paper chart/ EMR    Morphine Nausea And Vomiting   Penicillins Hives and Rash   Aspirin    Baclofen    Gluten Meal Cough, Diarrhea, Nausea And Vomiting and Swelling   Nsaids    Latex Rash     Review of Systems: All systems reviewed and negative except where noted in HPI.   Lab Results  Component Value Date   WBC 6.2 06/01/2021   HGB 11.8 (L) 06/01/2021   HCT 36.8 06/01/2021   MCV 69.9 Repeated and verified X2. (L) 06/01/2021   PLT 368.0 06/01/2021    Lab Results  Component Value Date   CREATININE 0.94 06/01/2021   BUN 11 06/01/2021   NA 138 06/01/2021   K 4.1 06/01/2021   CL 102 06/01/2021   CO2 29 06/01/2021    Lab Results  Component Value Date   IRON 73 06/01/2021   TIBC 260 06/01/2021    FERRITIN 44 06/01/2021    Lab Results  Component Value Date   ALT 16 12/17/2019   AST 16 12/17/2019   ALKPHOS 83 12/17/2019   BILITOT 0.5 12/17/2019     Physical Exam: BP 124/80   Pulse 80   Ht '5\' 6"'$  (1.676 m)   Wt 211 lb 3.2 oz (95.8 kg)   BMI 34.09 kg/m  Constitutional: Pleasant,well-developed, female in no acute distress. Neurological: Alert and oriented to person place and time. Psychiatric: Normal mood and affect. Behavior is normal.   ASSESSMENT AND PLAN: 61 year old female here for reassessment of the following:  Chronic constipation Gluten intolerance GERD Long-term use of PPI Dyspnea on exertion  Reviewed all of these issues with her today.  She has been doing a bit better with her constipation with some dietary changes for which she follows with her nutritionist.  She will continue these measures as it helped.  She is currently on MiraLAX once daily and still having some constipation but better than it has been.  She can increase her MiraLAX dosing to twice daily or higher as needed.  She wants to avoid other medications if she can, although is interested in trying IBSrela as we had free samples of today.  She will try this and let me know if she would like it in the future.  We reviewed her history of gluten intolerance in the past and GERD as well.  Recall we had planned to do an EGD to assess for possible underlying celiac disease.  Given her ethnicity, celiac disease in general would  be less likely, especially in the setting of normal blood work with symptoms.  That being said I am not sure exactly how she was told she had celiac disease in the past and needs an EGD to clarify this as she has never had 1.  We also discussed doing the EGD for screening for Barrett's esophagus.  She is agreeable to endoscopy after discussing it, however given her symptoms of dyspnea on exertion and pending cardiology consultation, she needs to have her cardiac work-up done first and  completed prior to proceeding with endoscopy.  She agrees to this.  She will follow-up with cardiology and let me know when she is ready to schedule the endoscopy.  Otherwise we discussed long-term risk benefits of chronic PPI use, she is using low-dose omeprazole, she will continue that for now as it works very well for her, further recommendations pending results of her endoscopy.  Plan: - continue dietary measures as they seem to be helping with her constipation / bloating - continue Miralax, can increase to BID dosing if needed - free sample of IBSrela given if she wants to try that as well - discussed long term risks of PPI use, she wishes to continue omeprazole - EGD in the [place] once she has completed her cardiac evaluation. She will contact us once that is completed.  I spent 35 minutes of time, including in depth chart review,  face-to-face time with the patient, and documenting this encounter.   Jolly Mango, MD [facility]

## 2021-09-03 ENCOUNTER — Telehealth: Payer: Self-pay

## 2021-09-03 DIAGNOSIS — M47812 Spondylosis without myelopathy or radiculopathy, cervical region: Secondary | ICD-10-CM | POA: Diagnosis not present

## 2021-09-03 DIAGNOSIS — M5416 Radiculopathy, lumbar region: Secondary | ICD-10-CM | POA: Diagnosis not present

## 2021-09-03 DIAGNOSIS — G894 Chronic pain syndrome: Secondary | ICD-10-CM | POA: Diagnosis not present

## 2021-09-03 DIAGNOSIS — M47816 Spondylosis without myelopathy or radiculopathy, lumbar region: Secondary | ICD-10-CM | POA: Diagnosis not present

## 2021-09-03 NOTE — Telephone Encounter (Signed)
-----   Message from Althea Charon, [place] sent at 08/24/2021  2:54 PM EDT ----- Please refer to cardiology due to dyspnea on exertion and history of "blockage"

## 2021-09-24 IMAGING — XA Imaging study
2 series · 2 of 2 positions shown · non-contrast
Comparison: none

CLINICAL DATA: Lumbosacral spondylosis without myelopathy with
radiculopathy. Chronic low back pain radiating into the right
greater than left legs. Right lateral recess stenosis at L5-S1. Good
relief from prior epidural injections at outside facility.

[Series 1: ortho standard · 1 of 1 slices shown (1 of 2)]
[im 1/1]
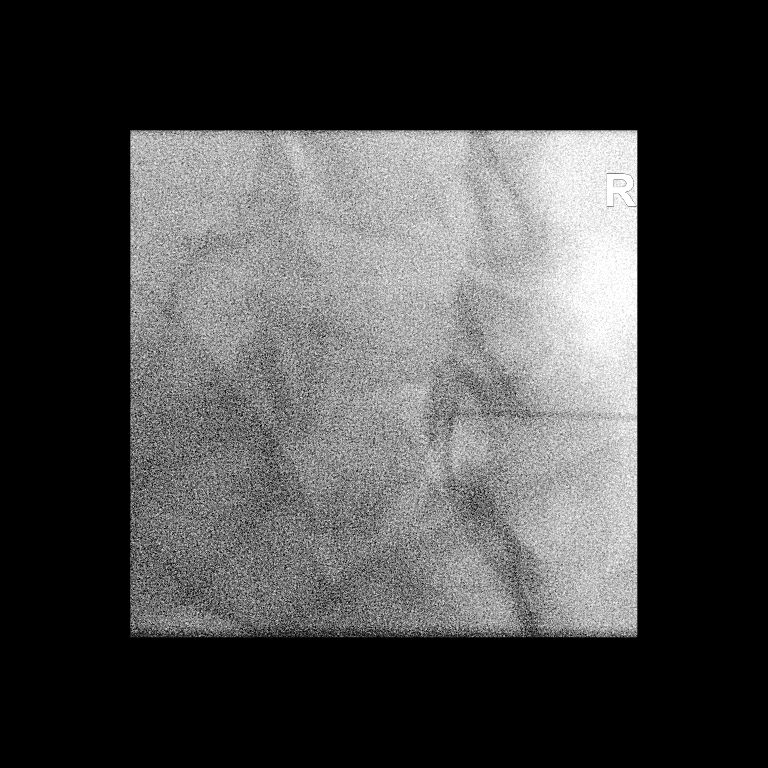

[Series 2: ortho standard · 1 of 1 slices shown (2 of 2)]
[im 1/1]
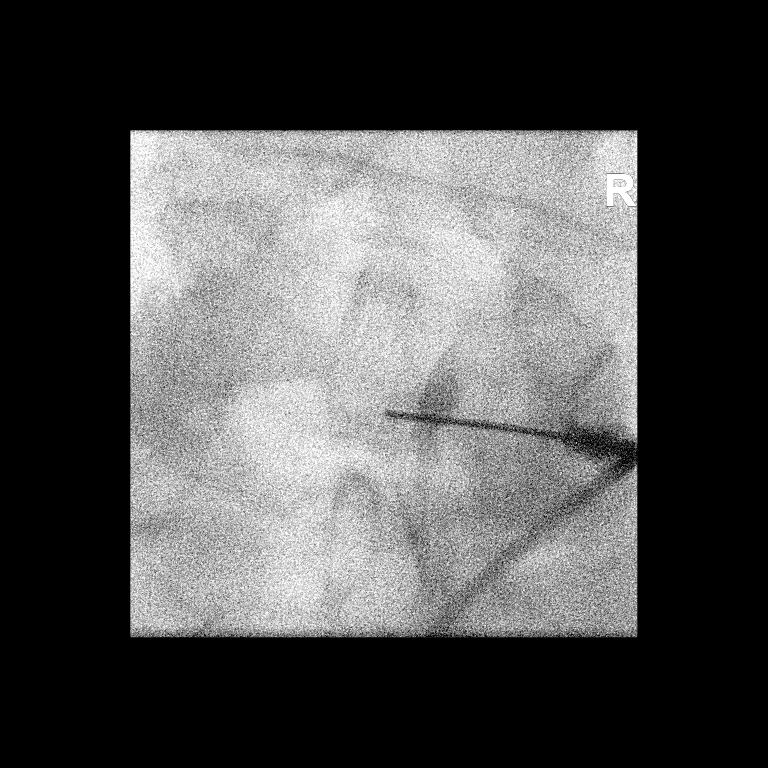

[2 of 2 positions shown; findings below may reference images not displayed]

FLUOROSCOPY TIME:  Radiation Exposure Index (as provided by the
fluoroscopic device): 2.9 mGy

Fluoroscopy Time:  23 seconds

Number of Acquired Images:  0

PROCEDURE:
The procedure, risks, benefits, and alternatives were explained to
the patient. Questions regarding the procedure were encouraged and
answered. The patient understands and consents to the procedure.

LUMBAR EPIDURAL INJECTION:

An interlaminar approach was performed on the right at L5-S1. The
overlying skin was cleansed and anesthetized. A 3.5 inch 20 gauge
epidural needle was advanced using loss-of-resistance technique.

DIAGNOSTIC EPIDURAL INJECTION:

Injection of Isovue-M 200 shows a good epidural pattern with spread
above and below the level of needle placement, primarily on the
right. No vascular opacification is seen.

THERAPEUTIC EPIDURAL INJECTION:

120 mg of Depo-Medrol mixed with 3 mL of 1% lidocaine were
instilled. The procedure was well-tolerated, and the patient was
discharged thirty minutes following the injection in good condition.

COMPLICATIONS:
None immediate.
IMPRESSION: Technically successful interlaminar epidural injection on the right
at L5-S1.

## 2021-09-30 ENCOUNTER — Ambulatory Visit: Payer: Federal, State, Local not specified - PPO | Admitting: Allergy

## 2021-10-02 DIAGNOSIS — M47812 Spondylosis without myelopathy or radiculopathy, cervical region: Secondary | ICD-10-CM | POA: Diagnosis not present

## 2021-10-02 DIAGNOSIS — G894 Chronic pain syndrome: Secondary | ICD-10-CM | POA: Diagnosis not present

## 2021-10-02 DIAGNOSIS — M4726 Other spondylosis with radiculopathy, lumbar region: Secondary | ICD-10-CM | POA: Diagnosis not present

## 2021-10-12 ENCOUNTER — Ambulatory Visit: Payer: Federal, State, Local not specified - PPO | Admitting: Cardiology

## 2021-10-12 ENCOUNTER — Encounter: Payer: Self-pay | Admitting: Cardiology

## 2021-10-12 VITALS — BP 110/60 | HR 78 | Ht 66.0 in | Wt 208.0 lb

## 2021-10-12 DIAGNOSIS — R002 Palpitations: Secondary | ICD-10-CM

## 2021-10-12 DIAGNOSIS — I10 Essential (primary) hypertension: Secondary | ICD-10-CM

## 2021-10-12 DIAGNOSIS — R0609 Other forms of dyspnea: Secondary | ICD-10-CM | POA: Diagnosis not present

## 2021-10-12 DIAGNOSIS — R609 Edema, unspecified: Secondary | ICD-10-CM

## 2021-10-12 DIAGNOSIS — R072 Precordial pain: Secondary | ICD-10-CM | POA: Insufficient documentation

## 2021-10-12 DIAGNOSIS — Z86718 Personal history of other venous thrombosis and embolism: Secondary | ICD-10-CM | POA: Insufficient documentation

## 2021-10-12 DIAGNOSIS — Z8249 Family history of ischemic heart disease and other diseases of the circulatory system: Secondary | ICD-10-CM

## 2021-10-12 DIAGNOSIS — D561 Beta thalassemia: Secondary | ICD-10-CM

## 2021-10-12 LAB — BASIC METABOLIC PANEL
BUN/Creatinine Ratio: 16 (ref 12–28)
BUN: 14 mg/dL (ref 8–27)
CO2: 26 mmol/L (ref 20–29)
Calcium: 9.9 mg/dL (ref 8.7–10.3)
Chloride: 105 mmol/L (ref 96–106)
Creatinine, Ser: 0.87 mg/dL (ref 0.57–1.00)
Glucose: 115 mg/dL — ABNORMAL HIGH (ref 70–99)
Potassium: 4.7 mmol/L (ref 3.5–5.2)
Sodium: 143 mmol/L (ref 134–144)
eGFR: 75 mL/min/{1.73_m2} (ref >59)

## 2021-10-12 MED ORDER — METOPROLOL TARTRATE 100 MG PO TABS: 100.0000 mg | ORAL_TABLET | Freq: Once | ORAL | 0 refills | Status: DC

## 2021-10-12 MED ORDER — METOPROLOL TARTRATE 100 MG PO TABS: 100.0000 mg | ORAL_TABLET | ORAL | 0 refills | Status: DC

## 2021-10-12 NOTE — Assessment & Plan Note (Signed)
MCV 69.  Hemoglobin within normal range.

## 2021-10-12 NOTE — Assessment & Plan Note (Signed)
We will proceed with coronary CT scan with possible FFR analysis.  Very strong family history of coronary artery disease, precordial chest pressure/pain occasionally when she is short of breath with asthma-like symptoms.  We want to make sure that she does not have any significant CAD.

## 2021-10-12 NOTE — Patient Instructions (Signed)
Medication Instructions:  Your physician recommends that you continue on your current medications as directed. Please refer to the Current Medication list given to you today.  *If you need a refill on your cardiac medications before your next appointment, please call your pharmacy*   Lab Work: Bmet If you have labs (blood work) drawn today and your tests are completely normal, you will receive your results only by: [place] (if you have MyChart) OR A paper copy in the mail If you have any lab test that is abnormal or we need to change your treatment, we will call you to review the results.   Testing/Procedures: ECHO Your physician has requested that you have an echocardiogram. Echocardiography is a painless test that uses sound waves to create images of your heart. It provides your doctor with information about the size and shape of your heart and how well your heart's chambers and valves are working. This procedure takes approximately one hour. There are no restrictions for this procedure.   Cardiac CT Your physician has requested that you have cardiac CT. Cardiac computed tomography (CT) is a painless test that uses an x-ray machine to take clear, detailed pictures of your heart. For further information please visit [email or web address]. Please follow instruction sheet as given.    Follow-Up: At [facility], you and your health needs are our priority.  As part of our continuing mission to provide you with exceptional heart care, we have created designated Provider Care Teams.  These Care Teams include your primary Cardiologist (physician) and Advanced Practice Providers (APPs -  Physician Assistants and Nurse Practitioners) who all work together to provide you with the care you need, when you need it.  We recommend signing up for the patient portal called "MyChart".  Sign up information is provided on this After Visit Summary.  MyChart is used to connect with patients for Virtual  Visits (Telemedicine).  Patients are able to view lab/test results, encounter notes, upcoming appointments, etc.  Non-urgent messages can be sent to your provider as well.   To learn more about what you can do with MyChart, go to [email or web address].    Your next appointment:   12 month(s)  The format for your next appointment:   In Person  Provider:   Candee Furbish, MD     Other Instructions     [facility] [address] [place], [place] [address] [phone]  Please arrive at the [facility] and Children's Entrance (Entrance C2) of [facility] 30 minutes prior to test start time. You can use the FREE valet parking offered at entrance C (encouraged to control the heart rate for the test)  Proceed to the [facility] Radiology Department (first floor) to check-in and test prep.  All radiology patients and guests should use entrance C2 at [facility], accessed from [facility], even though the hospital's physical address listed is [address].      Please follow these instructions carefully (unless otherwise directed):  On the Night Before the Test: Be sure to Drink plenty of water. Do not consume any caffeinated/decaffeinated beverages or chocolate 12 hours prior to your test. Do not take any antihistamines 12 hours prior to your test.   On the Day of the Test: Drink plenty of water until 1 hour prior to the test. Do not eat any food 4 hours prior to the test. You may take your regular medications prior to the test.  Take metoprolol (Lopressor) two  hours prior to test. HOLD Furosemide/Hydrochlorothiazide morning of the test. FEMALES- please wear underwire-free bra if available, avoid dresses & tight clothing       After the Test: Drink plenty of water. After receiving IV contrast, you may experience a mild flushed feeling. This is normal. On occasion, you may experience a mild rash up to 24 hours after the test.  This is not dangerous. If this occurs, you can take Benadryl 25 mg and increase your fluid intake. If you experience trouble breathing, this can be serious. If it is severe call 911 IMMEDIATELY. If it is mild, please call our office. If you take any of these medications: Glipizide/Metformin, Avandament, Glucavance, please do not take 48 hours after completing test unless otherwise instructed.  We will call to schedule your test 2-4 weeks out understanding that some insurance companies will need an authorization prior to the service being performed.   For non-scheduling related questions, please contact the cardiac imaging nurse navigator should you have any questions/concerns: Marchia Bond, Cardiac Imaging Nurse Navigator Gordy Clement, Cardiac Imaging Nurse Navigator [facility] Direct Office Dial: [phone]   For scheduling needs, including cancellations and rescheduling, please call [place], [phone].   Important Information About Sugar

## 2021-10-12 NOTE — Assessment & Plan Note (Signed)
No recurrence.  Treated with anticoagulation previously.

## 2021-10-12 NOTE — Assessment & Plan Note (Signed)
Father and brother both had massive heart attacks at age 63.

## 2021-10-12 NOTE — Assessment & Plan Note (Signed)
Continue with current prescription drug management on both carvedilol as well as Azor, amlodipine olmesartan 10/20

## 2021-10-12 NOTE — Assessment & Plan Note (Signed)
Previously worked up.  Left leg greater than right.  States that she has been told she has small veins.  Conservative management with leg elevation seems to work.  Excellent.

## 2021-10-12 NOTE — Assessment & Plan Note (Signed)
Echocardiogram to demonstrate structure and function

## 2021-10-12 NOTE — Assessment & Plan Note (Signed)
Previously has been worked up in the past.  No high risk symptoms such as syncope.  She feels her heart pounding occasionally at night.  We will keep an eye on this.  If symptoms worsen or become more worrisome, she can always proceed with a ZIO monitor.  Continue with carvedilol 25 mg twice a day for prescription drug management.

## 2021-10-12 NOTE — Progress Notes (Signed)
Cardiology Office Note:    Date:  10/12/2021   ID:  Tiffany Martin, DOB 09-May-1960, MRN [id number]  PCP:  Libby Maw, MD   [facility] Providers Cardiologist:  Candee Furbish, MD     Referring MD: Libby Maw,*    History of Present Illness:    Tiffany Martin is a 61 y.o. female with dyspnea on exertion and history of "blockage" here for evaluation at the request of Dr. Ethelene Hal.  Strong family history of coronary artery disease-had a father and brother who both died of massive heart attack at age 61.  Mother has A-fib and heart failure.  Never smoked.  Nondiabetic.  She is treated for hypertension.  Prior history of DVT, isolated episode treated with anticoagulation then taken off in [place]  Has MCV 69, Hgb 11.8 - B Thal. Ferritin 44  Her EKG today shows normal sinus rhythm 78 with T wave inversion in V1 through V3 consider anterior ischemia.  From [place]. Had left lower ext swelling with pooling/edema. Watched it for 2 years. No change over 2 years. Small veins noted. Has to set with legs elevated and this helps.  Dx. With Celiac. Went to dietician. Had reaction to cane sugar.   Occasional problems with breathing. At night laying down hears heart beat fast. Tested her and dx with asthma. Mild pressure, chest cold like.   Denies any fevers chills nausea vomiting syncope bleeding    Past Medical History:  Diagnosis Date   Abdominal hernia    Beta thalassemia (HCC)    Dyspnea on exertion    GERD (gastroesophageal reflux disease)    High blood pressure    Irritable bowel syndrome    Ischemic colitis (HCC)    Lactose intolerance     Past Surgical History:  Procedure Laterality Date   ABDOMINAL HYSTERECTOMY     APPENDECTOMY     CESAREAN SECTION     COLONOSCOPY     KNEE SURGERY Right    x 3   KNEE SURGERY Left    x 2   ROTATOR CUFF REPAIR Left    SHOULDER SURGERY Bilateral    for thoracic outlet syndrome   TONSILLECTOMY     UPPER  GASTROINTESTINAL ENDOSCOPY      Current Medications: Current Meds  Medication Sig   acetaminophen (TYLENOL) 500 MG tablet Take 1,000 mg by mouth every 8 (eight) hours as needed.   albuterol (VENTOLIN HFA) 108 (90 Base) MCG/ACT inhaler INHALE 2 PUFFS INTO THE LUNGS EVERY 6 HOURS AS NEEDED FOR WHEEZING OR SHORTNESS OF BREATH   amlodipine-olmesartan (AZOR) 10-20 MG tablet TAKE 1 TABLET BY MOUTH DAILY   aspirin 81 MG chewable tablet Chew 1 tablet by mouth daily.   B Complex-Biotin-FA (B-100 COMPLEX PO) Take 1 capsule by mouth daily.   Carbinoxamine Maleate (RYVENT) 6 MG TABS Take 1 tablet by mouth 2 times a day as needed   carvedilol (COREG) 25 MG tablet TAKE 1 TABLET(25 MG) BY MOUTH TWICE DAILY   Cholecalciferol (VITAMIN D3) 25 MCG (1000 UT) CAPS Take 1 capsule by mouth daily.   DIGESTIVE ENZYMES PO Take 1 capsule by mouth 2 (two) times daily with a meal.   DULoxetine (CYMBALTA) 60 MG capsule Take 1 capsule (60 mg total) by mouth daily.   EPINEPHrine 0.3 mg/0.3 mL IJ SOAJ injection Inject into the muscle.   ferrous sulfate 325 (65 FE) MG tablet Take 1 tablet by mouth daily.   gabapentin (NEURONTIN) 600 MG tablet Take 1 tablet (600  mg total) by mouth 3 (three) times daily.   hydrOXYzine (VISTARIL) 25 MG capsule Take 25-50 mg by mouth at bedtime as needed.   metoprolol tartrate (LOPRESSOR) 100 MG tablet Take 1 tablet (100 mg total) by mouth once for 1 dose. Take 1 tablet (100 mg total) two hours prior to CT scan.   Olopatadine HCl 0.2 % SOLN Place 1 drop into each eye daily as needed for itchy, watery eyes.   Olopatadine-Mometasone 169-67 MCG/ACT SUSP Place 2 sprays into the nose as needed. Place 2 (two) sprays per nostril 1-2 times daily as needed.   Omeprazole 20 MG TBEC Take 1 tablet by mouth daily.   ondansetron (ZOFRAN) 4 MG tablet Take 1 tablet (4 mg total) by mouth every 8 (eight) hours as needed.   polyethylene glycol (MIRALAX) 17 g packet Take as directed once to twice daily   RYALTRIS  665-25 MCG/ACT SUSP Place 2 sprays into the nose 2 (two) times daily.   SUMAtriptan (IMITREX) 50 MG tablet Take 1 tablet (50 mg total) by mouth every 2 (two) hours as needed for migraine. May repeat once in 2 hours if headache persists or recurs.   Tenapanor HCl (IBSRELA) 50 MG TABS Take 50 mg by mouth daily.   tiZANidine (ZANAFLEX) 4 MG tablet Take 4 mg by mouth 3 (three) times daily as needed.   traMADol (ULTRAM) 50 MG tablet Take 2 tablets (100 mg total) by mouth every 8 (eight) hours as needed.   vitamin C (ASCORBIC ACID) 500 MG tablet Take 500 mg by mouth daily.     Allergies:   Pork-derived products, Codeine, Morphine, Penicillins, Aspirin, Baclofen, Gluten meal, Nsaids, and Latex   Social History   Socioeconomic History   Marital status: Divorced    Spouse name: Not on file   Number of children: Not on file   Years of education: Not on file   Highest education level: Not on file  Occupational History   Not on file  Tobacco Use   Smoking status: Never   Smokeless tobacco: Never  Vaping Use   Vaping Use: Never used  Substance and Sexual Activity   Alcohol use: Never   Drug use: Never   Sexual activity: Not on file  Other Topics Concern   Not on file  Social History Narrative   Not on file   Social Determinants of Health   Financial Resource Strain: Not on file  Food Insecurity: Not on file  Transportation Needs: Not on file  Physical Activity: Not on file  Stress: Not on file  Social Connections: Not on file     Family History: The patient's family history includes Breast cancer in her maternal aunt; Cirrhosis in her father; Liver disease in her sister. There is no history of Colon cancer, Esophageal cancer, Stomach cancer, Pancreatic cancer, or Rectal cancer.  ROS:   Please see the history of present illness.     All other systems reviewed and are negative.  EKGs/Labs/Other Studies Reviewed:    The following studies were reviewed today: Prior chest x-ray  from 2020 in [place] showed no acute cardiopulmonary abnormalities.  Primary care office notes reviewed.  EKG:  EKG is  ordered today.  The ekg ordered today demonstrates T wave inversion in precordial leads sinus rhythm 78  Recent Labs: 06/01/2021: BUN 11; Creatinine, Ser 0.94; Hemoglobin 11.8; Platelets 368.0; Potassium 4.1; Sodium 138  Recent Lipid Panel    Component Value Date/Time   CHOL 157 03/20/2019 0819   TRIG  160.0 (H) 03/20/2019 0819   HDL 47.70 03/20/2019 0819   CHOLHDL 3 03/20/2019 0819   VLDL 32.0 03/20/2019 0819   LDLCALC 78 03/20/2019 0819   LDLDIRECT 71.0 03/20/2019 0819     Risk Assessment/Calculations:              Physical Exam:    VS:  BP 110/60 (BP Location: Left Arm, Patient Position: Sitting, Cuff Size: Normal)   Pulse 78   Ht '5\' 6"'$  (1.676 m)   Wt 208 lb (94.3 kg)   BMI 33.57 kg/m     Wt Readings from Last 3 Encounters:  10/12/21 208 lb (94.3 kg)  08/28/21 211 lb 3.2 oz (95.8 kg)  08/24/21 209 lb 9.6 oz (95.1 kg)     GEN:  Well nourished, well developed in no acute distress HEENT: Normal NECK: No JVD; No carotid bruits LYMPHATICS: No lymphadenopathy CARDIAC: RRR, soft systolic murmur, no rubs, gallops RESPIRATORY:  Clear to auscultation without rales, wheezing or rhonchi  ABDOMEN: Soft, non-tender, non-distended MUSCULOSKELETAL:  No edema; No deformity  SKIN: Warm and dry NEUROLOGIC:  Alert and oriented x 3 PSYCHIATRIC:  Normal affect   ASSESSMENT:    1. Precordial pain   2. Precordial chest pain   3. Palpitations   4. Essential hypertension   5. Dyspnea on exertion   6. History of DVT (deep vein thrombosis)   7. Beta thalassemia ([place])   8. Dependent edema   9. Family history of early CAD    PLAN:    In order of problems listed above:  Precordial chest pain We will proceed with coronary CT scan with possible FFR analysis.  Very strong family history of coronary artery disease, precordial chest pressure/pain occasionally  when she is short of breath with asthma-like symptoms.  We want to make sure that she does not have any significant CAD.  Palpitations Previously has been worked up in the past.  No high risk symptoms such as syncope.  She feels her heart pounding occasionally at night.  We will keep an eye on this.  If symptoms worsen or become more worrisome, she can always proceed with a ZIO monitor.  Continue with carvedilol 25 mg twice a day for prescription drug management.  Essential hypertension Continue with current prescription drug management on both carvedilol as well as Azor, amlodipine olmesartan 10/20  Dyspnea on exertion Echocardiogram to demonstrate structure and function  History of DVT (deep vein thrombosis) No recurrence.  Treated with anticoagulation previously.   Beta thalassemia (HCC) MCV 69.  Hemoglobin within normal range.  Dependent edema Previously worked up.  Left leg greater than right.  States that she has been told she has small veins.  Conservative management with leg elevation seems to work.  Excellent.  Family history of early CAD Father and brother both had massive heart attacks at age 14.         Medication Adjustments/Labs and Tests Ordered: Current medicines are reviewed at length with the patient today.  Concerns regarding medicines are outlined above.  Orders Placed This Encounter  Procedures   CT CORONARY MORPH W/CTA COR W/SCORE W/CA W/CM &/OR WO/CM   Basic metabolic panel   EKG 76-PPJK   ECHOCARDIOGRAM COMPLETE   Meds ordered this encounter  Medications   metoprolol tartrate (LOPRESSOR) 100 MG tablet    Sig: Take 1 tablet (100 mg total) by mouth once for 1 dose. Take 1 tablet (100 mg total) two hours prior to CT scan.    Dispense:  1 tablet    Refill:  0    Patient Instructions  Medication Instructions:  Your physician recommends that you continue on your current medications as directed. Please refer to the Current Medication list given to you  today.  *If you need a refill on your cardiac medications before your next appointment, please call your pharmacy*   Lab Work: Bmet If you have labs (blood work) drawn today and your tests are completely normal, you will receive your results only by: [place] (if you have MyChart) OR A paper copy in the mail If you have any lab test that is abnormal or we need to change your treatment, we will call you to review the results.   Testing/Procedures: ECHO Your physician has requested that you have an echocardiogram. Echocardiography is a painless test that uses sound waves to create images of your heart. It provides your doctor with information about the size and shape of your heart and how well your heart's chambers and valves are working. This procedure takes approximately one hour. There are no restrictions for this procedure.   Cardiac CT Your physician has requested that you have cardiac CT. Cardiac computed tomography (CT) is a painless test that uses an x-ray machine to take clear, detailed pictures of your heart. For further information please visit [email or web address].[email or web address]. Please follow instruction sheet as given.    Follow-Up: At [facility], you and your health needs are our priority.  As part of our continuing mission to provide you with exceptional heart care, we have created designated Provider Care Teams.  These Care Teams include your primary Cardiologist (physician) and Advanced Practice Providers (APPs -  Physician Assistants and Nurse Practitioners) who all work together to provide you with the care you need, when you need it.  We recommend signing up for the patient portal called "MyChart".  Sign up information is provided on this After Visit Summary.  MyChart is used to connect with patients for Virtual Visits (Telemedicine).  Patients are able to view lab/test results, encounter notes, upcoming appointments, etc.  Non-urgent messages can be sent to your provider as  well.   To learn more about what you can do with MyChart, go to [email or web address].    Your next appointment:   12 month(s)  The format for your next appointment:   In Person  Provider:   Candee Furbish, MD     Other Instructions     [facility] [address] [place], [place] [address] [phone]  Please arrive at the [facility] and Children's Entrance (Entrance C2) of [facility] 30 minutes prior to test start time. You can use the FREE valet parking offered at entrance C (encouraged to control the heart rate for the test)  Proceed to the [facility] Radiology Department (first floor) to check-in and test prep.  All radiology patients and guests should use entrance C2 at [facility], accessed from [facility], even though the hospital's physical address listed is [address]      Please follow these instructions carefully (unless otherwise directed):  On the Night Before the Test: Be sure to Drink plenty of water. Do not consume any caffeinated/decaffeinated beverages or chocolate 12 hours prior to your test. Do not take any antihistamines 12 hours prior to your test.   On the Day of the Test: Drink plenty of water until 1 hour prior to the test. Do not eat any food 4 hours prior to the test.  You may take your regular medications prior to the test.  Take metoprolol (Lopressor) two hours prior to test. HOLD Furosemide/Hydrochlorothiazide morning of the test. FEMALES- please wear underwire-free bra if available, avoid dresses & tight clothing       After the Test: Drink plenty of water. After receiving IV contrast, you may experience a mild flushed feeling. This is normal. On occasion, you may experience a mild rash up to 24 hours after the test. This is not dangerous. If this occurs, you can take Benadryl 25 mg and increase your fluid intake. If you experience trouble breathing, this can be serious. If it is  severe call 911 IMMEDIATELY. If it is mild, please call our office. If you take any of these medications: Glipizide/Metformin, Avandament, Glucavance, please do not take 48 hours after completing test unless otherwise instructed.  We will call to schedule your test 2-4 weeks out understanding that some insurance companies will need an authorization prior to the service being performed.   For non-scheduling related questions, please contact the cardiac imaging nurse navigator should you have any questions/concerns: Marchia Bond, Cardiac Imaging Nurse Navigator Gordy Clement, Cardiac Imaging Nurse Navigator [place] [facility] Direct Office Dial: [phone]   For scheduling needs, including cancellations and rescheduling, please call [place], [phone].   Important Information About Sugar         Signed, Candee Furbish, MD  10/12/2021 9:13 AM    [facility]

## 2021-10-12 NOTE — Addendum Note (Signed)
Addended by: Vergia Alcon A on: 10/12/2021 10:34 AM   Modules accepted: Orders

## 2021-10-26 ENCOUNTER — Ambulatory Visit (HOSPITAL_COMMUNITY): Payer: Federal, State, Local not specified - PPO | Attending: Cardiology

## 2021-10-26 DIAGNOSIS — R072 Precordial pain: Secondary | ICD-10-CM | POA: Insufficient documentation

## 2021-10-26 DIAGNOSIS — R0609 Other forms of dyspnea: Secondary | ICD-10-CM | POA: Diagnosis not present

## 2021-10-26 LAB — ECHOCARDIOGRAM COMPLETE
Area-P 1/2: 4.21 cm2
S' Lateral: 2.8 cm

## 2021-10-27 ENCOUNTER — Other Ambulatory Visit: Payer: Self-pay | Admitting: Family Medicine

## 2021-10-27 DIAGNOSIS — I10 Essential (primary) hypertension: Secondary | ICD-10-CM

## 2021-10-30 DIAGNOSIS — M47812 Spondylosis without myelopathy or radiculopathy, cervical region: Secondary | ICD-10-CM | POA: Diagnosis not present

## 2021-10-30 DIAGNOSIS — M47816 Spondylosis without myelopathy or radiculopathy, lumbar region: Secondary | ICD-10-CM | POA: Diagnosis not present

## 2021-10-30 DIAGNOSIS — G894 Chronic pain syndrome: Secondary | ICD-10-CM | POA: Diagnosis not present

## 2021-10-30 DIAGNOSIS — M5416 Radiculopathy, lumbar region: Secondary | ICD-10-CM | POA: Diagnosis not present

## 2021-11-03 ENCOUNTER — Telehealth (HOSPITAL_COMMUNITY): Payer: Self-pay | Admitting: Emergency Medicine

## 2021-11-03 NOTE — Telephone Encounter (Signed)
Pt requesting to r/s appt as shes going out of town and will not be back til end of aug. New appt made for 12/08/21 at 8:30am.  Will reach out closer to appt to review instructions in detail so shes prepared.  Pt appreciated the call  Marchia Bond RN Navigator Cardiac Imaging [facility] [phone] Office  [phone] Cell

## 2021-11-04 ENCOUNTER — Ambulatory Visit (HOSPITAL_COMMUNITY): Payer: Federal, State, Local not specified - PPO

## 2021-11-26 DIAGNOSIS — M5416 Radiculopathy, lumbar region: Secondary | ICD-10-CM | POA: Diagnosis not present

## 2021-11-26 DIAGNOSIS — G894 Chronic pain syndrome: Secondary | ICD-10-CM | POA: Diagnosis not present

## 2021-11-26 DIAGNOSIS — M47812 Spondylosis without myelopathy or radiculopathy, cervical region: Secondary | ICD-10-CM | POA: Diagnosis not present

## 2021-11-26 DIAGNOSIS — M47816 Spondylosis without myelopathy or radiculopathy, lumbar region: Secondary | ICD-10-CM | POA: Diagnosis not present

## 2021-11-30 ENCOUNTER — Ambulatory Visit: Payer: Federal, State, Local not specified - PPO | Admitting: Family Medicine

## 2021-12-04 ENCOUNTER — Telehealth (HOSPITAL_COMMUNITY): Payer: Self-pay | Admitting: Emergency Medicine

## 2021-12-04 NOTE — Telephone Encounter (Signed)
Attempted to call patient regarding upcoming cardiac CT appointment. °Left message on voicemail with name and callback number °Cayman Brogden RN Navigator Cardiac Imaging °[place] [facility] °[phone] Office °[phone] Cell ° °

## 2021-12-08 ENCOUNTER — Ambulatory Visit (HOSPITAL_COMMUNITY): Admission: RE | Admit: 2021-12-08 | Payer: Federal, State, Local not specified - PPO | Source: Ambulatory Visit

## 2021-12-14 ENCOUNTER — Encounter: Payer: Self-pay | Admitting: Cardiology

## 2021-12-22 ENCOUNTER — Encounter: Payer: Self-pay | Admitting: Family Medicine

## 2021-12-22 ENCOUNTER — Ambulatory Visit: Payer: Federal, State, Local not specified - PPO | Admitting: Family Medicine

## 2021-12-22 VITALS — BP 118/72 | HR 79 | Temp 97.2°F | Ht 66.0 in | Wt 212.2 lb

## 2021-12-22 DIAGNOSIS — E538 Deficiency of other specified B group vitamins: Secondary | ICD-10-CM | POA: Diagnosis not present

## 2021-12-22 DIAGNOSIS — E611 Iron deficiency: Secondary | ICD-10-CM | POA: Diagnosis not present

## 2021-12-22 DIAGNOSIS — Z114 Encounter for screening for human immunodeficiency virus [HIV]: Secondary | ICD-10-CM

## 2021-12-22 DIAGNOSIS — R7309 Other abnormal glucose: Secondary | ICD-10-CM

## 2021-12-22 DIAGNOSIS — E7800 Pure hypercholesterolemia, unspecified: Secondary | ICD-10-CM

## 2021-12-22 DIAGNOSIS — I10 Essential (primary) hypertension: Secondary | ICD-10-CM | POA: Diagnosis not present

## 2021-12-22 LAB — BASIC METABOLIC PANEL
BUN: 11 mg/dL (ref 6–23)
CO2: 30 mEq/L (ref 19–32)
Calcium: 9.6 mg/dL (ref 8.4–10.5)
Chloride: 103 mEq/L (ref 96–112)
Creatinine, Ser: 0.83 mg/dL (ref 0.40–1.20)
GFR: 75.33 mL/min (ref >60.00)
Glucose, Bld: 114 mg/dL — ABNORMAL HIGH (ref 70–99)
Potassium: 4.5 mEq/L (ref 3.5–5.1)
Sodium: 140 mEq/L (ref 135–145)

## 2021-12-22 LAB — LDL CHOLESTEROL, DIRECT: Direct LDL: 72 mg/dL

## 2021-12-22 LAB — VITAMIN B12: Vitamin B-12: 1026 pg/mL — ABNORMAL HIGH (ref 211–911)

## 2021-12-22 LAB — HEMOGLOBIN A1C: Hgb A1c MFr Bld: 5.8 % (ref 4.6–6.5)

## 2021-12-22 NOTE — Progress Notes (Signed)
Established Patient Office Visit  Subjective   Patient ID: Tiffany Martin, female    DOB: 13-Oct-1960  Age: 61 y.o. MRN: [id number]  Chief Complaint  Patient presents with   Follow-up    6 month follow up, no concerns. Patient not fasting.     HPI follow-up of hypertension, iron deficiency with microcytic anemia and history of beta thalassemia, elevated glucose, elevated cholesterol.  Screen for hep C in the recent past was negative.  Okay with screening for HIV today.  Denies risky behaviors.  Blood pressure controlled with Azor and metoprolol.  Continues every other day iron sulfate.  Has not been exercising aerobically.  Has started a walking program.  Recently started Ibsrela for irritable bowel.  She has regular dental care.    Review of Systems  Constitutional: Negative.   HENT: Negative.    Eyes:  Negative for blurred vision, discharge and redness.  Respiratory: Negative.    Cardiovascular: Negative.   Gastrointestinal:  Negative for abdominal pain.  Genitourinary: Negative.   Musculoskeletal: Negative.  Negative for myalgias.  Skin:  Negative for rash.  Neurological:  Negative for tingling, loss of consciousness and weakness.  Endo/Heme/Allergies:  Negative for polydipsia.      12/22/2021    9:01 AM 06/01/2021    8:50 AM 11/28/2020    9:59 AM  Depression screen PHQ 2/9  Decreased Interest 0 0 0  Down, Depressed, Hopeless 0 0 0  PHQ - 2 Score 0 0 0        Objective:     BP 118/72 (BP Location: Left Arm, Patient Position: Sitting, Cuff Size: Large)   Pulse 79   Temp (!) 97.2 F (36.2 C) (Temporal)   Ht '5\' 6"'$  (1.676 m)   Wt 212 lb 3.2 oz (96.3 kg)   SpO2 94%   BMI 34.25 kg/m    Physical Exam Constitutional:      General: She is not in acute distress.    Appearance: Normal appearance. She is not ill-appearing, toxic-appearing or diaphoretic.  HENT:     Head: Normocephalic and atraumatic.     Right Ear: Tympanic membrane, ear canal and external ear  normal.     Left Ear: Tympanic membrane, ear canal and external ear normal.     Mouth/Throat:     Mouth: Mucous membranes are moist.     Pharynx: Oropharynx is clear. No oropharyngeal exudate or posterior oropharyngeal erythema.  Eyes:     General: No scleral icterus.       Right eye: No discharge.        Left eye: No discharge.     Extraocular Movements: Extraocular movements intact.     Conjunctiva/sclera: Conjunctivae normal.     Pupils: Pupils are equal, round, and reactive to light.  Cardiovascular:     Rate and Rhythm: Normal rate and regular rhythm.  Pulmonary:     Effort: Pulmonary effort is normal. No respiratory distress.     Breath sounds: Normal breath sounds.  Musculoskeletal:     Cervical back: No rigidity or tenderness.  Skin:    General: Skin is warm and dry.  Neurological:     Mental Status: She is alert and oriented to person, place, and time.  Psychiatric:        Mood and Affect: Mood normal.        Behavior: Behavior normal.      No results found for any visits on 12/22/21.    The 10-year ASCVD risk score (Arnett  DK, et al., 2019) is: 5.3%    Assessment & Plan:   Problem List Items Addressed This Visit       Cardiovascular and Mediastinum   Essential hypertension   Relevant Orders   Basic metabolic panel     Other   Screening for HIV (human immunodeficiency virus)   Relevant Orders   HIV Antibody (routine testing w rflx)   Iron deficiency - Primary   Relevant Orders   Iron, TIBC and Ferritin Panel   B12 deficiency   Relevant Orders   Vitamin B12   Elevated glucose   Relevant Orders   Basic metabolic panel   Hemoglobin A1c   Elevated cholesterol   Relevant Orders   LDL cholesterol, direct    Return in about 6 months (around 06/22/2022).  Encouraged patient to start her walking program again and continue with chair yoga.  Information was given on exercising to lose weight.  Continue all medicines as above.  Libby Maw,  MD

## 2021-12-23 ENCOUNTER — Telehealth (HOSPITAL_COMMUNITY): Payer: Self-pay | Admitting: *Deleted

## 2021-12-23 NOTE — Telephone Encounter (Signed)
Reaching out to patient to offer assistance regarding upcoming cardiac imaging study; pt verbalizes understanding of appt date/time, parking situation and where to check in, medications ordered, and verified current allergies; name and call back number provided for further questions should they arise  Gordy Clement RN Navigator Cardiac Imaging [facility] [phone] office [phone] cell  Patient to take '100mg'$  metoprolol tartrate two hours prior to her cardiac CT scan. She is aware to arrive at 8am.

## 2021-12-23 NOTE — Telephone Encounter (Signed)
Attempted to call patient regarding upcoming cardiac CT appointment. °Left message on voicemail with name and callback number ° °[name] RN Navigator Cardiac Imaging °[place] [facility] °[phone] Office °[phone] Cell ° °

## 2021-12-24 ENCOUNTER — Ambulatory Visit (HOSPITAL_COMMUNITY): Admission: RE | Admit: 2021-12-24 | Payer: Federal, State, Local not specified - PPO | Source: Ambulatory Visit

## 2021-12-24 ENCOUNTER — Encounter (HOSPITAL_COMMUNITY): Payer: Self-pay

## 2022-01-19 DIAGNOSIS — M5412 Radiculopathy, cervical region: Secondary | ICD-10-CM | POA: Diagnosis not present

## 2022-01-19 DIAGNOSIS — G5603 Carpal tunnel syndrome, bilateral upper limbs: Secondary | ICD-10-CM | POA: Diagnosis not present

## 2022-01-21 ENCOUNTER — Ambulatory Visit: Payer: Federal, State, Local not specified - PPO | Admitting: Allergy

## 2022-01-21 DIAGNOSIS — M47816 Spondylosis without myelopathy or radiculopathy, lumbar region: Secondary | ICD-10-CM | POA: Diagnosis not present

## 2022-01-21 DIAGNOSIS — M5416 Radiculopathy, lumbar region: Secondary | ICD-10-CM | POA: Diagnosis not present

## 2022-01-21 DIAGNOSIS — G894 Chronic pain syndrome: Secondary | ICD-10-CM | POA: Diagnosis not present

## 2022-01-21 DIAGNOSIS — M47812 Spondylosis without myelopathy or radiculopathy, cervical region: Secondary | ICD-10-CM | POA: Diagnosis not present

## 2022-02-03 ENCOUNTER — Encounter: Payer: Self-pay | Admitting: Allergy

## 2022-02-03 ENCOUNTER — Ambulatory Visit: Payer: Federal, State, Local not specified - PPO | Admitting: Allergy

## 2022-02-03 VITALS — BP 112/68 | HR 87 | Temp 98.2°F | Resp 16 | Wt 217.2 lb

## 2022-02-03 DIAGNOSIS — T781XXD Other adverse food reactions, not elsewhere classified, subsequent encounter: Secondary | ICD-10-CM | POA: Diagnosis not present

## 2022-02-03 DIAGNOSIS — Z884 Allergy status to anesthetic agent status: Secondary | ICD-10-CM

## 2022-02-03 DIAGNOSIS — J4520 Mild intermittent asthma, uncomplicated: Secondary | ICD-10-CM

## 2022-02-03 DIAGNOSIS — H1013 Acute atopic conjunctivitis, bilateral: Secondary | ICD-10-CM | POA: Diagnosis not present

## 2022-02-03 DIAGNOSIS — J3089 Other allergic rhinitis: Secondary | ICD-10-CM | POA: Diagnosis not present

## 2022-02-03 DIAGNOSIS — T7819XD Other adverse food reactions, not elsewhere classified, subsequent encounter: Secondary | ICD-10-CM

## 2022-02-03 MED ORDER — FLUTICASONE PROPIONATE HFA 110 MCG/ACT IN AERO: 2.0000 | INHALATION_SPRAY | Freq: Two times a day (BID) | RESPIRATORY_TRACT | 5 refills | Status: DC

## 2022-02-03 NOTE — Progress Notes (Signed)
Follow-up Note  RE: Tiffany Martin MRN: [id number] DOB: [birth year] Date of Office Visit: 02/03/2022   History of present illness: Tiffany Martin is a 61 y.o. female presenting today for follow-up of reactive airway, allergic rhinitis with conjunctivitis, adverse food reaction and medication allergy.  She was last seen in the office on 08/24/2021 by myself.  She states with her asthma she did have several bad days in October and has essentially been using albuterol 2 puffs twice a day for the whole month.  She states prior to these bad days in October she was sick but was tested negative for COVID.  She did see her PCP who she states she probably did have COVID.   She states she did not get the Flovent and as she did not do a month trial of inhaled steroid.   With her allergies she states the RyVent was not covered with her insurance that she is taking Xyzal in the evening and also taking Benadryl twice a day.  She states the Ryaltris nasal spray has been helpful drainage control and she takes 2 sprays in the morning. She does continue to avoid pork, gluten and dairy products.  She has access to an epinephrine device. She has history of obstructive sleep apnea.  She has seen pulmonologist Dr. Ander Slade.  She has had a sleep study states before she moved here she had a CPAP but she has not received her new CPAP states it has been 2 years.  She is not sure what to do about this at this time.  Review of systems: Review of Systems  Constitutional: Negative.   HENT: Negative.    Eyes: Negative.   Respiratory:  Positive for cough and shortness of breath.   Cardiovascular: Negative.   Gastrointestinal: Negative.   Musculoskeletal: Negative.   Skin: Negative.   Allergic/Immunologic: Negative.   Neurological: Negative.      All other systems negative unless noted above in HPI  Past medical/social/surgical/family history have been reviewed and are unchanged unless specifically indicated  below.  No changes  Medication List: Current Outpatient Medications  Medication Sig Dispense Refill   acetaminophen (TYLENOL) 500 MG tablet Take 1,000 mg by mouth every 8 (eight) hours as needed.     albuterol (VENTOLIN HFA) 108 (90 Base) MCG/ACT inhaler INHALE 2 PUFFS INTO THE LUNGS EVERY 6 HOURS AS NEEDED FOR WHEEZING OR SHORTNESS OF BREATH 54 g 1   amlodipine-olmesartan (AZOR) 10-20 MG tablet TAKE 1 TABLET BY MOUTH DAILY 90 tablet 1   aspirin 81 MG chewable tablet Chew 1 tablet by mouth daily.     B Complex-Biotin-FA (B-100 COMPLEX PO) Take 1 capsule by mouth daily.     Carbinoxamine Maleate (RYVENT) 6 MG TABS Take 1 tablet by mouth 2 times a day as needed 60 tablet 1   carvedilol (COREG) 25 MG tablet TAKE 1 TABLET(25 MG) BY MOUTH TWICE DAILY 180 tablet 2   Cholecalciferol (VITAMIN D3) 25 MCG (1000 UT) CAPS Take 1 capsule by mouth daily.     DIGESTIVE ENZYMES PO Take 1 capsule by mouth 2 (two) times daily with a meal.     DULoxetine (CYMBALTA) 60 MG capsule Take 1 capsule (60 mg total) by mouth daily. 90 capsule 1   EPINEPHrine 0.3 mg/0.3 mL IJ SOAJ injection Inject into the muscle.     ferrous sulfate 325 (65 FE) MG tablet Take 1 tablet by mouth daily.     fluticasone (FLOVENT HFA) 110 MCG/ACT inhaler Inhale 2 puffs  into the lungs 2 (two) times daily. With spacer 12 g 5   gabapentin (NEURONTIN) 600 MG tablet Take 1 tablet (600 mg total) by mouth 3 (three) times daily. 270 tablet 1   hydrOXYzine (VISTARIL) 25 MG capsule Take 25-50 mg by mouth at bedtime as needed.     metoprolol tartrate (LOPRESSOR) 100 MG tablet Take 1 tablet (100 mg total) by mouth as directed. Take 1 tablet (100 mg total) two hours prior to CT scan. 1 tablet 0   Olopatadine HCl 0.2 % SOLN Place 1 drop into each eye daily as needed for itchy, watery eyes. 2.5 mL 1   Olopatadine-Mometasone 665-25 MCG/ACT SUSP Place 2 sprays into the nose as needed. Place 2 (two) sprays per nostril 1-2 times daily as needed. 29 g 1    Omeprazole 20 MG TBEC Take 1 tablet by mouth daily.     ondansetron (ZOFRAN) 4 MG tablet Take 1 tablet (4 mg total) by mouth every 8 (eight) hours as needed. 20 tablet 2   polyethylene glycol (MIRALAX) 17 g packet Take as directed once to twice daily 14 each 0   SUMAtriptan (IMITREX) 50 MG tablet Take 1 tablet (50 mg total) by mouth every 2 (two) hours as needed for migraine. May repeat once in 2 hours if headache persists or recurs. 10 tablet 2   Tenapanor HCl (IBSRELA) 50 MG TABS Take 50 mg by mouth daily. 12 tablet 0   tiZANidine (ZANAFLEX) 4 MG tablet Take 4 mg by mouth 3 (three) times daily as needed.     traMADol (ULTRAM) 50 MG tablet Take 2 tablets (100 mg total) by mouth every 8 (eight) hours as needed. 90 tablet 0   vitamin C (ASCORBIC ACID) 500 MG tablet Take 500 mg by mouth daily.     No current facility-administered medications for this visit.     Known medication allergies: Allergies  Allergen Reactions   Pork-Derived Products Anaphylaxis   Codeine Nausea And Vomiting    documented per [facility] paper chart/ EMR    Morphine Nausea And Vomiting   Penicillins Hives and Rash   Aspirin    Baclofen    Gluten Meal Cough, Diarrhea, Nausea And Vomiting and Swelling   Nsaids    Latex Rash     Physical examination: Blood pressure 112/68, pulse 87, temperature 98.2 F (36.8 C), temperature source Temporal, resp. rate 16, weight 217 lb 3.2 oz (98.5 kg), SpO2 95 %.  General: Alert, interactive, in no acute distress. HEENT: PERRLA, TMs pearly gray, turbinates minimally edematous without discharge, post-pharynx non erythematous. Neck: Supple without lymphadenopathy. Lungs: Clear to auscultation without wheezing, rhonchi or rales. {no increased work of breathing. CV: Normal S1, S2 without murmurs. Abdomen: Nondistended, nontender. Skin: Warm and dry, without lesions or rashes. Extremities:  No clubbing, cyanosis or edema. Neuro:   Grossly intact.  Diagnositics/Labs:  Spirometry:  FEV1: 1.81L 81%, FVC: 2.05L 72% predicted.  Improved study from previous and normal FEV1 today for age/demographics.  Assessment and plan:   Reactive airway disease - Have access to albuterol inhaler 2 puffs every 4-6 hours as needed for cough/wheeze/shortness of breath/chest tightness.  May use 15-20 minutes prior to activity.   Monitor frequency of use.   - Use with spacer device - Start Flovent (or equivalent inhaler covered by insurance).  If Flovent take 2 puffs twice a day with spacer device.  This is a controller inhaler meant to be used daily.  Rinse mouth after use.   Asthma control  goals:  Full participation in all desired activities (may need albuterol before activity) Albuterol use two time or less a week on average (not counting use with activity) Cough interfering with sleep two time or less a month Oral steroids no more than once a year No hospitalizations   Allergic rhinitis with conjunctivisits - Testing on 05/22/21 was positive to: grasses and lab work for environmental allergens were negative - Continue taking: Ryaltris (olopatadine/mometasone) two sprays per nostril 1-2 times daily as needed.  This has an nasal steroid (like flonase) and nasal antihistamine to help with congestion and drainage control.In the right nostril, point the applicator out toward the right ear. In the left nostril, point the applicator out toward the left ear Olopatadine 0.2% 1 drop each eye daily as needed for itchy/watery eyes - Continue Xyzal in the evening.    - Take Allegra or Zyrtec tab in the morning - Consider nasal saline rinses 1-2 times daily to remove allergens from the nasal cavities as well as help with mucous clearance (this is especially helpful to do before the nasal sprays are given)  Adverse food reaction - Continue avoidance of pork product, gluten and dairy (avoid dairy due to lactose intolerance) - Have access to self-injectable epinephrine (Epipen or AuviQ) 0.'3mg'$  at all  times - Follow emergency action plan in case of allergic reaction -IgE to pork, milk and wheat are negative.skin testing on 05/22/21 to wheat, cow's milk, and pork were negative.  She is eligible for in office food challenge to these foods if interested.These  in office challenges would be done on separate days. She would need to be off all antihistamines 3 days prior to the challenge and need to bring the food item with her. Also, she will need to be in good health the day of the challenge. These appointments last approximately 2-3 hours.  Adverse medication effect - Continue avoidance of medications you are reactive too - We will do skin testing to lidocaine and methylprednisolone once your breathing gets better. Discussed how the challenges to both of these drugs would need to be done on separate days.  - Please also find out the dosage of methylprednisolone your doctor would like [place] to challenge with.  Obstructive sleep apnea -Advised to reach out to Dr. Judson Roch office regarding the status of her CPAP machine Follow-up in 4 months or sooner if needed  I appreciate the opportunity to take part in Cristol's care. Please do not hesitate to contact me with questions.  Sincerely,   Prudy Feeler, MD Allergy/Immunology [facility] [place] [facility] [place]

## 2022-02-03 NOTE — Patient Instructions (Addendum)
Reactive airway disease - Have access to albuterol inhaler 2 puffs every 4-6 hours as needed for cough/wheeze/shortness of breath/chest tightness.  May use 15-20 minutes prior to activity.   Monitor frequency of use.   - Use with spacer device - Start Flovent (or equivalent inhaler covered by insurance).  If Flovent take 2 puffs twice a day with spacer device.  This is a controller inhaler meant to be used daily.  Rinse mouth after use.   Asthma control goals:  Full participation in all desired activities (may need albuterol before activity) Albuterol use two time or less a week on average (not counting use with activity) Cough interfering with sleep two time or less a month Oral steroids no more than once a year No hospitalizations   Allergic rhinitis with conjunctivisits - Testing on 05/22/21 was positive to: grasses and lab work for environmental allergens were negative - Continue taking: Ryaltris (olopatadine/mometasone) two sprays per nostril 1-2 times daily as needed.  This has an nasal steroid (like flonase) and nasal antihistamine to help with congestion and drainage control.In the right nostril, point the applicator out toward the right ear. In the left nostril, point the applicator out toward the left ear Olopatadine 0.2% 1 drop each eye daily as needed for itchy/watery eyes - Continue Xyzal in the evening.    - Take Allegra or Zyrtec tab in the morning - Consider nasal saline rinses 1-2 times daily to remove allergens from the nasal cavities as well as help with mucous clearance (this is especially helpful to do before the nasal sprays are given)  Adverse food reaction - Continue avoidance of pork product, gluten and dairy (avoid dairy due to lactose intolerance) - Have access to self-injectable epinephrine (Epipen or AuviQ) 0.'3mg'$  at all times - Follow emergency action plan in case of allergic reaction -IgE to pork, milk and wheat are negative.skin testing on 05/22/21 to wheat,  cow's milk, and pork were negative.  She is eligible for in office food challenge to these foods if interested.These  in office challenges would be done on separate days. She would need to be off all antihistamines 3 days prior to the challenge and need to bring the food item with her. Also, she will need to be in good health the day of the challenge. These appointments last approximately 2-3 hours.  Adverse medication effect - Continue avoidance of medications you are reactive too - We will do skin testing to lidocaine and methylprednisolone once your breathing gets better. Discussed how the challenges to both of these drugs would need to be done on separate days.  - Please also find out the dosage of methylprednisolone your doctor would like [place] to challenge with.  Obstructive sleep apnea -Advised to reach out to Dr. Judson Roch office regarding the status of her CPAP machine Follow-up in 4 months or sooner if needed

## 2022-02-17 DIAGNOSIS — M47816 Spondylosis without myelopathy or radiculopathy, lumbar region: Secondary | ICD-10-CM | POA: Diagnosis not present

## 2022-02-17 DIAGNOSIS — M5416 Radiculopathy, lumbar region: Secondary | ICD-10-CM | POA: Diagnosis not present

## 2022-02-17 DIAGNOSIS — G894 Chronic pain syndrome: Secondary | ICD-10-CM | POA: Diagnosis not present

## 2022-02-17 DIAGNOSIS — M47812 Spondylosis without myelopathy or radiculopathy, cervical region: Secondary | ICD-10-CM | POA: Diagnosis not present

## 2022-02-18 ENCOUNTER — Encounter: Payer: Self-pay | Admitting: Family Medicine

## 2022-02-19 ENCOUNTER — Ambulatory Visit: Payer: Federal, State, Local not specified - PPO | Admitting: Family Medicine

## 2022-02-22 ENCOUNTER — Ambulatory Visit: Payer: Federal, State, Local not specified - PPO | Admitting: Family Medicine

## 2022-02-22 ENCOUNTER — Encounter: Payer: Self-pay | Admitting: Family Medicine

## 2022-02-22 VITALS — BP 110/68 | HR 68 | Temp 97.6°F | Ht 66.0 in | Wt 211.6 lb

## 2022-02-22 DIAGNOSIS — J4521 Mild intermittent asthma with (acute) exacerbation: Secondary | ICD-10-CM | POA: Diagnosis not present

## 2022-02-22 DIAGNOSIS — J22 Unspecified acute lower respiratory infection: Secondary | ICD-10-CM

## 2022-02-22 MED ORDER — AZITHROMYCIN 250 MG PO TABS: ORAL_TABLET | ORAL | 0 refills | Status: AC

## 2022-02-22 MED ORDER — BENZONATATE 200 MG PO CAPS: 200.0000 mg | ORAL_CAPSULE | Freq: Two times a day (BID) | ORAL | 0 refills | Status: DC | PRN

## 2022-02-22 NOTE — Progress Notes (Signed)
Established Patient Office Visit  Subjective   Patient ID: Tiffany Martin, female    DOB: [birth year]  Age: 61 y.o. MRN: [id number]  Chief Complaint  Patient presents with   Cough    Cough, congestion, headaches and body chills x 1 week.     Cough Associated symptoms include chills, a fever, headaches, a sore throat and wheezing. Pertinent negatives include no eye redness, myalgias or rash.   1 week history of headache, fever chills, congestion, sore throat, postnasal drip, cough wheezing with myalgias and neuralgias.  Headache and fever resolved.  Persist.  Cough is productive.  Plan.  Headaches have resolved.  History of asthma.  Controller medication recently added.  She has been reaching for her out    Review of Systems  Constitutional:  Positive for chills, fever and malaise/fatigue.  HENT:  Positive for congestion and sore throat.   Eyes:  Negative for blurred vision, discharge and redness.  Respiratory:  Positive for cough, sputum production and wheezing.   Cardiovascular: Negative.   Gastrointestinal:  Negative for abdominal pain.  Genitourinary: Negative.   Musculoskeletal: Negative.  Negative for joint pain and myalgias.  Skin:  Negative for rash.  Neurological:  Positive for headaches. Negative for tingling, loss of consciousness and weakness.  Endo/Heme/Allergies:  Negative for polydipsia.      Objective:     BP 110/68 (BP Location: Right Arm, Patient Position: Sitting, Cuff Size: Large)   Pulse 68   Temp 97.6 F (36.4 C) (Temporal)   Ht '5\' 6"'$  (1.676 m)   Wt 211 lb 9.6 oz (96 kg)   SpO2 94%   BMI 34.15 kg/m    Physical Exam Constitutional:      General: She is not in acute distress.    Appearance: Normal appearance. She is not ill-appearing, toxic-appearing or diaphoretic.  HENT:     Head: Normocephalic and atraumatic.     Right Ear: External ear normal.     Left Ear: External ear normal.     Mouth/Throat:     Mouth: Mucous membranes are moist.      Pharynx: Oropharynx is clear. No oropharyngeal exudate or posterior oropharyngeal erythema.  Eyes:     General: No scleral icterus.       Right eye: No discharge.        Left eye: No discharge.     Extraocular Movements: Extraocular movements intact.     Conjunctiva/sclera: Conjunctivae normal.     Pupils: Pupils are equal, round, and reactive to light.  Cardiovascular:     Rate and Rhythm: Normal rate and regular rhythm.  Pulmonary:     Effort: Pulmonary effort is normal. No respiratory distress.     Breath sounds: Normal breath sounds. No wheezing or rales.  Abdominal:     General: Bowel sounds are normal.  Musculoskeletal:     Cervical back: No rigidity or tenderness.  Skin:    General: Skin is warm and dry.  Neurological:     Mental Status: She is alert and oriented to person, place, and time.  Psychiatric:        Mood and Affect: Mood normal.        Behavior: Behavior normal.      No results found for any visits on 02/22/22.    The 10-year ASCVD risk score (Arnett DK, et al., 2019) is: 4.7%    Assessment & Plan:   Problem List Items Addressed This Visit   None Visit Diagnoses  Lower respiratory infection    -  Primary   Relevant Medications   azithromycin (ZITHROMAX) 250 MG tablet   benzonatate (TESSALON) 200 MG capsule   Other Relevant Orders   COVID-19, Flu A+B and RSV   Mild intermittent reactive airway disease with acute exacerbation       Relevant Medications   azithromycin (ZITHROMAX) 250 MG tablet   benzonatate (TESSALON) 200 MG capsule       Return if symptoms worsen or fail to improve.  Continue Flovent.  May use for wheezing and/or cough.  Complete course of Zithromax.  We will take advantage of its anti-inflammatory properties for active airway disease.  Use Tessalon as needed for cough.  Follow-up in 1 week  Libby Maw, MD

## 2022-02-24 LAB — COVID-19, FLU A+B AND RSV
Influenza A, NAA: DETECTED — AB
Influenza B, NAA: NOT DETECTED
RSV, NAA: NOT DETECTED
SARS-CoV-2, NAA: NOT DETECTED

## 2022-03-17 DIAGNOSIS — G894 Chronic pain syndrome: Secondary | ICD-10-CM | POA: Diagnosis not present

## 2022-03-17 DIAGNOSIS — M47812 Spondylosis without myelopathy or radiculopathy, cervical region: Secondary | ICD-10-CM | POA: Diagnosis not present

## 2022-03-17 DIAGNOSIS — M47816 Spondylosis without myelopathy or radiculopathy, lumbar region: Secondary | ICD-10-CM | POA: Diagnosis not present

## 2022-03-17 DIAGNOSIS — M5416 Radiculopathy, lumbar region: Secondary | ICD-10-CM | POA: Diagnosis not present

## 2022-04-14 DIAGNOSIS — M47816 Spondylosis without myelopathy or radiculopathy, lumbar region: Secondary | ICD-10-CM | POA: Diagnosis not present

## 2022-04-14 DIAGNOSIS — M5416 Radiculopathy, lumbar region: Secondary | ICD-10-CM | POA: Diagnosis not present

## 2022-04-14 DIAGNOSIS — M47812 Spondylosis without myelopathy or radiculopathy, cervical region: Secondary | ICD-10-CM | POA: Diagnosis not present

## 2022-04-14 DIAGNOSIS — G894 Chronic pain syndrome: Secondary | ICD-10-CM | POA: Diagnosis not present

## 2022-04-18 ENCOUNTER — Other Ambulatory Visit: Payer: Self-pay | Admitting: Family Medicine

## 2022-04-18 DIAGNOSIS — I10 Essential (primary) hypertension: Secondary | ICD-10-CM

## 2022-05-12 DIAGNOSIS — M47816 Spondylosis without myelopathy or radiculopathy, lumbar region: Secondary | ICD-10-CM | POA: Diagnosis not present

## 2022-05-12 DIAGNOSIS — M47812 Spondylosis without myelopathy or radiculopathy, cervical region: Secondary | ICD-10-CM | POA: Diagnosis not present

## 2022-05-12 DIAGNOSIS — M5416 Radiculopathy, lumbar region: Secondary | ICD-10-CM | POA: Diagnosis not present

## 2022-05-12 DIAGNOSIS — G894 Chronic pain syndrome: Secondary | ICD-10-CM | POA: Diagnosis not present

## 2022-05-14 ENCOUNTER — Encounter: Payer: Self-pay | Admitting: Allergy

## 2022-05-17 ENCOUNTER — Other Ambulatory Visit: Payer: Self-pay | Admitting: Physical Medicine and Rehabilitation

## 2022-05-17 DIAGNOSIS — M5412 Radiculopathy, cervical region: Secondary | ICD-10-CM

## 2022-05-20 ENCOUNTER — Telehealth: Payer: Self-pay | Admitting: *Deleted

## 2022-05-20 NOTE — Telephone Encounter (Signed)
Patient called back and has been scheduled for lidocaine and methylprednisolone testing on Tuesday 05/25/22 at 8:30am, I did advise for her to go ahead and hold all of her antihistamines.

## 2022-05-20 NOTE — Telephone Encounter (Signed)
Noted  

## 2022-05-20 NOTE — Telephone Encounter (Signed)
Thank you for the update!

## 2022-05-25 ENCOUNTER — Emergency Department (HOSPITAL_COMMUNITY)
Admission: EM | Admit: 2022-05-25 | Discharge: 2022-05-25 | Disposition: A | Discharge status: Home / Self Care | Payer: Federal, State, Local not specified - PPO | Attending: Emergency Medicine | Admitting: Emergency Medicine

## 2022-05-25 ENCOUNTER — Other Ambulatory Visit: Payer: Self-pay

## 2022-05-25 ENCOUNTER — Telehealth: Payer: Self-pay

## 2022-05-25 ENCOUNTER — Ambulatory Visit: Payer: Federal, State, Local not specified - PPO | Admitting: Family

## 2022-05-25 ENCOUNTER — Encounter: Payer: Self-pay | Admitting: Family

## 2022-05-25 VITALS — BP 126/80 | HR 65 | Temp 97.7°F | Resp 20 | Wt 216.2 lb

## 2022-05-25 DIAGNOSIS — T50905D Adverse effect of unspecified drugs, medicaments and biological substances, subsequent encounter: Secondary | ICD-10-CM

## 2022-05-25 DIAGNOSIS — Z888 Allergy status to other drugs, medicaments and biological substances status: Secondary | ICD-10-CM | POA: Diagnosis not present

## 2022-05-25 DIAGNOSIS — R0602 Shortness of breath: Secondary | ICD-10-CM | POA: Insufficient documentation

## 2022-05-25 DIAGNOSIS — T7840XA Allergy, unspecified, initial encounter: Secondary | ICD-10-CM | POA: Insufficient documentation

## 2022-05-25 DIAGNOSIS — Z7982 Long term (current) use of aspirin: Secondary | ICD-10-CM | POA: Insufficient documentation

## 2022-05-25 DIAGNOSIS — R0789 Other chest pain: Secondary | ICD-10-CM | POA: Diagnosis not present

## 2022-05-25 DIAGNOSIS — Z91040 Latex allergy status: Secondary | ICD-10-CM | POA: Diagnosis not present

## 2022-05-25 DIAGNOSIS — I10 Essential (primary) hypertension: Secondary | ICD-10-CM | POA: Diagnosis not present

## 2022-05-25 DIAGNOSIS — Z79899 Other long term (current) drug therapy: Secondary | ICD-10-CM | POA: Diagnosis not present

## 2022-05-25 DIAGNOSIS — L299 Pruritus, unspecified: Secondary | ICD-10-CM | POA: Diagnosis not present

## 2022-05-25 LAB — CBC WITH DIFFERENTIAL/PLATELET
Abs Immature Granulocytes: 0.02 10*3/uL (ref 0.00–0.07)
Basophils Absolute: 0 10*3/uL (ref 0.0–0.1)
Basophils Relative: 1 %
Eosinophils Absolute: 0.2 10*3/uL (ref 0.0–0.5)
Eosinophils Relative: 4 %
HCT: 35.3 % — ABNORMAL LOW (ref 36.0–46.0)
Hemoglobin: 11.6 g/dL — ABNORMAL LOW (ref 12.0–15.0)
Immature Granulocytes: 0 %
Lymphocytes Relative: 40 %
Lymphs Abs: 2.2 10*3/uL (ref 0.7–4.0)
MCH: 22.7 pg — ABNORMAL LOW (ref 26.0–34.0)
MCHC: 32.9 g/dL (ref 30.0–36.0)
MCV: 69.1 fL — ABNORMAL LOW (ref 80.0–100.0)
Monocytes Absolute: 0.4 10*3/uL (ref 0.1–1.0)
Monocytes Relative: 7 %
Neutro Abs: 2.7 10*3/uL (ref 1.7–7.7)
Neutrophils Relative %: 48 %
Platelets: 349 10*3/uL (ref 150–400)
RBC: 5.11 MIL/uL (ref 3.87–5.11)
RDW: 17.8 % — ABNORMAL HIGH (ref 11.5–15.5)
WBC: 5.6 10*3/uL (ref 4.0–10.5)
nRBC: 0 % (ref 0.0–0.2)

## 2022-05-25 LAB — BASIC METABOLIC PANEL
Anion gap: 14 (ref 5–15)
BUN: 9 mg/dL (ref 8–23)
CO2: 22 mmol/L (ref 22–32)
Calcium: 9.2 mg/dL (ref 8.9–10.3)
Chloride: 103 mmol/L (ref 98–111)
Creatinine, Ser: 0.85 mg/dL (ref 0.44–1.00)
GFR, Estimated: >60 mL/min (ref >60)
Glucose, Bld: 165 mg/dL — ABNORMAL HIGH (ref 70–99)
Potassium: 3.9 mmol/L (ref 3.5–5.1)
Sodium: 139 mmol/L (ref 135–145)

## 2022-05-25 MED ORDER — DIAZEPAM 5 MG/ML IJ SOLN
2.0000 mg | Freq: Once | INTRAMUSCULAR | Status: AC
  Administered 2022-05-25: 2 mg via INTRAVENOUS
  Filled 2022-05-25: qty 2

## 2022-05-25 MED ORDER — FAMOTIDINE 20 MG PO TABS: 20.0000 mg | ORAL_TABLET | Freq: Every day | ORAL | 0 refills | Status: DC

## 2022-05-25 MED ORDER — DIPHENHYDRAMINE HCL 25 MG PO TABS: 25.0000 mg | ORAL_TABLET | Freq: Four times a day (QID) | ORAL | 0 refills | Status: AC | PRN

## 2022-05-25 MED ORDER — FAMOTIDINE IN NACL 20-0.9 MG/50ML-% IV SOLN
20.0000 mg | Freq: Once | INTRAVENOUS | Status: AC
  Administered 2022-05-25: 20 mg via INTRAVENOUS
  Filled 2022-05-25: qty 50

## 2022-05-25 NOTE — Telephone Encounter (Signed)
Spoke to patient, she has been home from the hospital for about an hour now. She stated they gave her some medication and kept her a little longer. Patient stated she is doing better. Patient was informed that if she has any issue she can call the office as we have an on call provider. Patient verbalized understanding and was appreciative for all the care she received during her reaction.

## 2022-05-25 NOTE — ED Provider Notes (Signed)
I provided a substantive portion of the care of this patient.  I personally made/approved the management plan for this patient and take responsibility for the patient management.  EKG Interpretation  Date/Time:  Tuesday May 25 2022 11:45:46 EST Ventricular Rate:  71 PR Interval:  152 QRS Duration: 83 QT Interval:  424 QTC Calculation: 461 R Axis:   61 Text Interpretation: Sinus rhythm Low voltage, precordial leads Nonspecific T abnormalities, anterior leads Confirmed by Lacretia Leigh ([id number]) on 05/25/2022 12:62:24 PM   62 year old female presents with acute allergic reaction for receiving injection.  Patient received 2 doses of epi prior to arrival here.  Patient's ranges at this time.  She is maintaining her airway.  No need for emergent airway intervention.  Will monitor here   Lacretia Leigh, MD 05/25/22 1335

## 2022-05-25 NOTE — Patient Instructions (Addendum)
Tiffany Martin was not  able to tolerate the methylprednisolone 40 mg/mL challenge today at the office without adverse signs or symptoms of an allergic reaction.   Schedule an appointment for lidocaine skin testing. You will need to be off all antihistamines 3 days prior to this appointment and in good health (not on any antibiotics in the past 7 days or any vaccines). This appointment will last approximately 2-3 hours

## 2022-05-25 NOTE — Discharge Instructions (Signed)
It was a pleasure taking care of you today.  You were seen for an allergic reaction to steroids.  I am sending you home with Pepcid and Benadryl.  Take for the next 5 days.  Please follow-up with PCP within the next week.  Use your EpiPen if you develop difficulties breathing or worsening symptoms.  Return to the ER for new or worsening symptoms.

## 2022-05-25 NOTE — ED Provider Notes (Signed)
[facility] Provider Note   CSN: [id number] Arrival date & time: 05/25/22  1139     History  Chief Complaint  Patient presents with   Allergic Reaction    Shalina Cowley is a 62 y.o. female with a past medical history significant for GERD, IBS, history of DVT, and hypertension who presents to the ED due to an allergic reaction.  Patient was at the allergy clinic and had a Solu-Medrol shot and began having an anaphylactic reaction.  Patient states she felt like her throat was closing up and was unable to breathe.  She notes she had some chest tightness as well.  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  Numerous allergies per patient.  Patient states she was sent to the allergy clinic due to previous reaction to IM Solu-Medrol which she was receiving for low back pain.  She had a skin test earlier today which was normal and then was given a IM dose of Solu-Medrol in which she had her reaction.  Patient was given 2 EpiPen's, 2 DuoNebs, and Benadryl prior to arrival.  During initial evaluation, patient admits to improvement in shortness of breath however, still endorses some shortness of breath.  No abdominal pain.  No nausea or vomiting.  Denies rash.  History obtained from patient and past medical records. No interpreter used during encounter.       Home Medications Prior to Admission medications   Medication Sig Start Date End Date Taking? Authorizing Provider  diphenhydrAMINE (BENADRYL) 25 MG tablet Take 1 tablet (25 mg total) by mouth every 6 (six) hours as needed. 05/25/22  Yes Renisha Cockrum, Druscilla Brownie, PA-C  famotidine (PEPCID) 20 MG tablet Take 1 tablet (20 mg total) by mouth daily for 5 days. 05/25/22 05/30/22 Yes Moselle Rister, Druscilla Brownie, PA-C  acetaminophen (TYLENOL) 500 MG tablet Take 1,000 mg by mouth every 8 (eight) hours as needed.    [provider]  albuterol (VENTOLIN HFA) 108 (90 Base) MCG/ACT inhaler INHALE 2  PUFFS INTO THE LUNGS EVERY 6 HOURS AS NEEDED FOR WHEEZING OR SHORTNESS OF BREATH 05/27/21   Kennith Gain, MD  amlodipine-olmesartan (AZOR) 10-20 MG tablet TAKE 1 TABLET BY MOUTH DAILY 04/19/22   Libby Maw, MD  aspirin 81 MG chewable tablet Chew 1 tablet by mouth daily.    [provider]  B Complex-Biotin-FA (B-100 COMPLEX PO) Take 1 capsule by mouth daily.    [provider]  benzonatate (TESSALON) 200 MG capsule Take 1 capsule (200 mg total) by mouth 2 (two) times daily as needed for cough. 02/22/22   Libby Maw, MD  Carbinoxamine Maleate (RYVENT) 6 MG TABS Take 1 tablet by mouth 2 times a day as needed Patient not taking: Reported on 02/22/2022 08/24/21   Althea Charon, FNP  carvedilol (COREG) 25 MG tablet TAKE 1 TABLET(25 MG) BY MOUTH TWICE DAILY 04/19/22   Libby Maw, MD  Cholecalciferol (VITAMIN D3) 25 MCG (1000 UT) CAPS Take 1 capsule by mouth daily.    [provider]  DIGESTIVE ENZYMES PO Take 1 capsule by mouth 2 (two) times daily with a meal.    [provider]  DULoxetine (CYMBALTA) 60 MG capsule Take 1 capsule (60 mg total) by mouth daily. 09/13/19   Libby Maw, MD  EPINEPHrine 0.3 mg/0.3 mL IJ SOAJ injection Inject into the muscle. 05/22/21   [provider]  ferrous sulfate 325 (65 FE) MG tablet Take 1 tablet by  mouth daily. 09/18/18   [provider]  fluticasone (FLOVENT HFA) 110 MCG/ACT inhaler Inhale 2 puffs into the lungs 2 (two) times daily. With spacer 02/03/22   Kennith Gain, MD  gabapentin (NEURONTIN) 600 MG tablet Take 1 tablet (600 mg total) by mouth 3 (three) times daily. 09/13/19   Libby Maw, MD  hydrOXYzine (VISTARIL) 25 MG capsule Take 25-50 mg by mouth at bedtime as needed. 07/07/21   [provider]  Olopatadine HCl 0.2 % SOLN Place 1 drop into each eye daily as needed for itchy, watery eyes. 05/22/21   Kennith Gain,  MD  Olopatadine-Mometasone [phone] MCG/ACT SUSP Place 2 sprays into the nose as needed. Place 2 (two) sprays per nostril 1-2 times daily as needed. 05/22/21   Kennith Gain, MD  Omeprazole 20 MG TBEC Take 1 tablet by mouth daily. 09/26/19   [provider]  ondansetron (ZOFRAN) 4 MG tablet Take 1 tablet (4 mg total) by mouth every 8 (eight) hours as needed. 07/08/21   Libby Maw, MD  polyethylene glycol [facility]) 17 g packet Take as directed once to twice daily 08/28/21   Armbruster, Carlota Raspberry, MD  SUMAtriptan (IMITREX) 50 MG tablet Take 1 tablet (50 mg total) by mouth every 2 (two) hours as needed for migraine. May repeat once in 2 hours if headache persists or recurs. 07/13/21   Libby Maw, MD  Tenapanor HCl (IBSRELA) 50 MG TABS Take 50 mg by mouth daily. 08/28/21   Armbruster, Carlota Raspberry, MD  tiZANidine (ZANAFLEX) 4 MG tablet Take 4 mg by mouth 3 (three) times daily as needed. 11/29/19   [provider]  traMADol (ULTRAM) 50 MG tablet Take 2 tablets (100 mg total) by mouth every 8 (eight) hours as needed. 09/13/19   Libby Maw, MD  vitamin C (ASCORBIC ACID) 500 MG tablet Take 500 mg by mouth daily.    [provider]      Allergies    Methylprednisolone, Pork-derived products, Codeine, Morphine, Penicillins, Aspirin, Baclofen, Gluten meal, Nsaids, and Latex    Review of Systems   Review of Systems  Respiratory:  Positive for cough, chest tightness and shortness of breath.   Cardiovascular:  Negative for leg swelling.  Gastrointestinal:  Negative for abdominal pain.  Skin:  Negative for rash.  All other systems reviewed and are negative.   Physical Exam Updated Vital Signs BP (!) 152/94   Pulse 82   Temp 98.3 F (36.8 C)   Resp (!) 29   Ht 5' 6"$  (1.676 m)   Wt 98 kg   SpO2 95%   BMI 34.87 kg/m  Physical Exam Vitals and nursing note reviewed.  Constitutional:      General: She is not in acute distress.     Appearance: She is not ill-appearing.  HENT:     Head: Normocephalic.  Eyes:     Pupils: Pupils are equal, round, and reactive to light.  Cardiovascular:     Rate and Rhythm: Normal rate and regular rhythm.     Pulses: Normal pulses.     Heart sounds: Normal heart sounds. No murmur heard.    No friction rub. No gallop.  Pulmonary:     Effort: Pulmonary effort is normal.     Breath sounds: Normal breath sounds.     Comments: Speaking in broken up sentences. No stridor or wheeze. Airway patent Abdominal:     General: Abdomen is flat. There is no distension.  Palpations: Abdomen is soft.     Tenderness: There is no abdominal tenderness. There is no guarding or rebound.  Musculoskeletal:        General: Normal range of motion.     Cervical back: Neck supple.  Skin:    General: Skin is warm and dry.     Comments: No rash  Neurological:     General: No focal deficit present.     Mental Status: She is alert.  Psychiatric:        Mood and Affect: Mood normal.        Behavior: Behavior normal.     ED Results / Procedures / Treatments   Labs (all labs ordered are listed, but only abnormal results are displayed) Labs Reviewed  BASIC METABOLIC PANEL - Abnormal; Notable for the following components:      Result Value   Glucose, Bld 165 (*)    All other components within normal limits  CBC WITH DIFFERENTIAL/PLATELET - Abnormal; Notable for the following components:   Hemoglobin 11.6 (*)    HCT 35.3 (*)    MCV 69.1 (*)    MCH 22.7 (*)    RDW 17.8 (*)    All other components within normal limits    EKG EKG Interpretation  Date/Time:  Tuesday May 25 2022 11:45:46 EST Ventricular Rate:  71 PR Interval:  152 QRS Duration: 83 QT Interval:  424 QTC Calculation: 461 R Axis:   61 Text Interpretation: Sinus rhythm Low voltage, precordial leads Nonspecific T abnormalities, anterior leads Confirmed by Lacretia Leigh ([id number]) on 05/25/2022 12:31:27 PM  Radiology No results  found.  Procedures Procedures    Medications Ordered in ED Medications  famotidine (PEPCID) IVPB 20 mg premix (0 mg Intravenous Stopped 05/25/22 1301)  diazepam (VALIUM) injection 2 mg (2 mg Intravenous Given 05/25/22 1319)    ED Course/ Medical Decision Making/ A&P Clinical Course as of 05/25/22 1601  Tue May 25, 2022  1318 Called to bedside by RN due to worsening SOB. No stridor or wheeze. Airway patent. Valium given. Will continue to monitor. [CA]  1426 Reassessed patient at bedside.  Patient admits to significant improvement in shortness of breath.  Denies feeling like her throat is closing up.  Will continue to monitor for 4 hours post EpiPen. [CA]    Clinical Course User Index [CA] Suzy Bouchard, PA-C                             Medical Decision Making Amount and/or Complexity of Data Reviewed External Data Reviewed: notes. Labs: ordered. Decision-making details documented in ED Course. ECG/medicine tests: ordered and independent interpretation performed. Decision-making details documented in ED Course.  Risk Prescription drug management.   This patient presents to the ED for concern of SOB, this involves an extensive number of treatment options, and is a complaint that carries with it a high risk of complications and morbidity.  The differential diagnosis includes allergic reaction, PE, cardiac etiology, etc  62 year old female presents to the ED due to anaphylactic reaction to IM Solu-Medrol from allergy clinic.  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  Numerous allergies.  Upon arrival, patient afebrile, not tachycardic or hypoxic.  Patient speaking in broken up sentences.  Lungs clear to auscultation bilaterally without stridor or wheeze.  Airway patent.  No rash.  Abdomen soft, nondistended, nontender.  Patient given 2 EpiPen's, 2 DuoNeb treatments, and 25 mg of IM Benadryl prior to arrival.  Patient states her breathing has improved 75%.  IV Pepcid given.  Will  continue to monitor for 4 hours after epinephrine dose.  CBC reassuring.  No leukocytosis.  Mild anemia with hemoglobin 11.6.  BMP reassuring.  Hyperglycemia 165.  No anion gap.  Normal renal function.  No major electrolyte derangements.  EKG normal sinus rhythm.   3:56 PM reassessed patient at bedside.  It has been over 4 hours since last EpiPen.  Patient states breathing is back to baseline.  Lungs clear to auscultation bilaterally.  No stridor or wheeze.  Airway patent.  Patient has EpiPen at home.  Will discharge with Pepcid and Benadryl for the next 5 days.  Will hold steroids given this is what caused her allergic reaction.  Patient stable for discharge. Strict ED precautions discussed with patient. Patient states understanding and agrees to plan. Patient discharged home in no acute distress and stable vitals  Discussed with Dr. Zenia Resides who evaluated patient at bedside and agrees with assessment and plan.        Final Clinical Impression(s) / ED Diagnoses Final diagnoses:  Allergic reaction, initial encounter    Rx / DC Orders ED Discharge Orders          Ordered    famotidine (PEPCID) 20 MG tablet  Daily        05/25/22 1559    diphenhydrAMINE (BENADRYL) 25 MG tablet  Every 6 hours PRN        05/25/22 1559              Karie Kirks 05/25/22 1602    Lacretia Leigh, MD 05/26/22 [phone]

## 2022-05-25 NOTE — ED Triage Notes (Signed)
Patient brought in by EMS from allergy clinic for allergic reaction following 97m of solumedrol IM. At clinic she was given 2 IM epi, 2 duo-ned, 242mof benadryl IM. She c/o tickle in throat and SOB but states not as bad as prior to meds. She has 20g in R wrist.

## 2022-05-25 NOTE — Progress Notes (Signed)
[name] [address] Dept: [phone]  FOLLOW UP NOTE  Patient ID: Tiffany Martin, female    DOB: Dec 18, 1960  Age: 62 y.o. MRN: [id number] Date of Office Visit: 05/25/2022  Assessment  Chief Complaint: Food/Drug Challenge (Methylpred...)  HPI Tiffany Martin is a 62 year old female who presents today for skin testing to methylprednisolone 40 mg/mL.  She was last seen on February 03, 2022 by Dr. Nelva Bush for reactive airway disease, allergic rhinitis with conjunctivitis, adverse food reaction, adverse medication effect, and obstructive sleep apnea.  She denies any new diagnosis or surgery since her last office visit.  She reports in the past that she was getting injections in the knee that consisted of Kenalog, lidocaine, and Marcaine.  With the first injection she did fine.  After that she started noticing gradual symptoms with continued injections.  After one of the injections she noticed that within an hour she developed headache, nausea, abdominal pain, diarrhea, and sweating.  She did not develop a rash.  Her symptoms would last approximately 3 to 7 days.  Dr. Greta Doom is wanting to give her injections, but uses lidocaine and methylprednisolone.  She has been off all antihistamines for the past 3 days and reports that she is in good health.  She denies any cardiorespiratory, gastrointestinal, and cutaneous symptoms.  All questions answered and informed consent signed.   Drug Allergies:  Allergies  Allergen Reactions   Methylprednisolone Anaphylaxis   Pork-Derived Products Anaphylaxis   Codeine Nausea And Vomiting    documented per [facility] paper chart/ EMR    Morphine Nausea And Vomiting   Penicillins Hives and Rash   Aspirin    Baclofen    Gluten Meal Cough, Diarrhea, Nausea And Vomiting and Swelling   Nsaids    Latex Rash    Review of Systems: Review of Systems  Constitutional:  Negative for chills and fever.  HENT:         Reports post nasal  drip since being off all antihistamines for the past 3 days. Denies rhinorrhea and nasal congestion  Eyes:        Denies itchy watery eyes  Respiratory:  Negative for cough, shortness of breath and wheezing.   Cardiovascular:  Negative for chest pain and palpitations.  Gastrointestinal:  Negative for abdominal pain, diarrhea, nausea and vomiting.  Genitourinary:  Negative for frequency.  Skin:  Negative for itching and rash.  Neurological:  Negative for headaches.  Endo/Heme/Allergies:  Positive for environmental allergies.     Physical Exam: BP 126/80   Pulse 65   Temp 97.7 F (36.5 C) (Temporal)   Resp 20   Wt 216 lb 3.2 oz (98.1 kg)   SpO2 97%   BMI 34.90 kg/m    Physical Exam Constitutional:      Appearance: Normal appearance.  HENT:     Head: Normocephalic and atraumatic.     Comments: Pharynx normal. Eyes normal. Ears normal. Nose: bilateral lower turbinates moderately edematous and erythematous with no drainage noted.    Right Ear: Tympanic membrane, ear canal and external ear normal.     Left Ear: Tympanic membrane, ear canal and external ear normal.     Mouth/Throat:     Mouth: Mucous membranes are moist.     Pharynx: Oropharynx is clear.  Eyes:     Conjunctiva/sclera: Conjunctivae normal.  Cardiovascular:     Rate and Rhythm: Regular rhythm.     Heart sounds: Normal heart sounds.  Pulmonary:     Effort:  Pulmonary effort is normal.     Breath sounds: Normal breath sounds.     Comments: Lungs clear to auscultation Musculoskeletal:     Cervical back: Neck supple.  Skin:    General: Skin is warm.     Comments: No rashes or urticarial lesions noted  Neurological:     Mental Status: She is alert and oriented to person, place, and time.  Psychiatric:        Mood and Affect: Mood normal.        Behavior: Behavior normal.        Thought Content: Thought content normal.        Judgment: Judgment normal.     Diagnostics:  Topical Anesthetic - 05/25/22 0856      Agent methylprednisolone acetate    Manufacturer [employer]    Lot # [id number]    Location Arm    Number of allergen test 3    Time Testing Placed 0855    Control SPT Negative    Histamine SPT 3+    Full Strength methylprednisolone SPT Negative    Select Select    Time Testing Placed 0918    Saline Control Intradermal Negative    1/100 methylprednisolone Intradermal Negative    Time Testing Placed 0937    1/10 methylprednisolone Intradermal Negative    Time Testing Placed 1020    1.0 Full Strength methylprednisolone Subcutaneous --   12m IM (40 mg)             During the percutaneous skin testing she reports that her head is starting to hurt and that the lights were bothering her eyes. She reports that she is susceptible to migraines. At 9:55 AM she reports that for the past few minutes she has noticed some tightness in her back. She denies any cardiorespiratory, gastrointestinal and cutaneous symptoms Her head does continue to hurt. She wonders if her crocheting has caused this feeling. She also mentions that Dr. BGreta Doomis testing her for a pinched nerve.  10:39 AM she complained of feeling nauseous spit up/dry heaves when she went to the bathroom.  Physical exam and vitals were stable.  She began coughing and complaining of a tickle in her throat.  She then complained of tightness in her chest.  At 11:02 AM she was given 0.3 mg of epinephrine IM.  At 11:05 AM she was given 25 mg IM of Benadryl.  At 11:06 AM she was given DuoNeb via nebulizer.  At 11:11 AM she was given additional epinephrine 0.3 mg IM.  EMS was called.  She reports that her tightness in the chest and throat is starting to get better.  Assessment and Plan: 1. Adverse effect of drug, subsequent encounter     No orders of the defined types were placed in this encounter.   Patient Instructions   Tiffany Martin was not  able to tolerate the methylprednisolone 40 mg/mL challenge today at the office without adverse signs  or symptoms of an allergic reaction.   Schedule an appointment for lidocaine skin testing. You will need to be off all antihistamines 3 days prior to this appointment and in good health (not on any antibiotics in the past 7 days or any vaccines). This appointment will last approximately 2-3 hours    Return in about 2 weeks (around 06/08/2022), or if symptoms worsen or fail to improve.    Thank you for the opportunity to care for this patient.  Please do not hesitate to contact me with questions.  Tiffany Charon, FNP [facility] [place]

## 2022-05-25 NOTE — Telephone Encounter (Signed)
LVM to find out how the patient was doing after challenge and going to the ER for further observation. Advised patient to call the office back if she does not get another call from the office tonight or tomorrow.

## 2022-05-26 ENCOUNTER — Encounter: Payer: Self-pay | Admitting: Family Medicine

## 2022-05-26 NOTE — Telephone Encounter (Signed)
Thanks Ashley

## 2022-05-27 ENCOUNTER — Telehealth: Payer: Self-pay

## 2022-05-27 NOTE — Telephone Encounter (Signed)
Perfecto.   Salvatore Marvel, MD [facility]

## 2022-05-27 NOTE — Transitions of Care (Post Inpatient/ED Visit) (Signed)
   05/27/2022  Name: Swetha Magnone MRN: [id number] DOB: 23-Aug-1960  Today's TOC FU Call Status: Today's TOC FU Call Status:: Unsuccessul Call (1st Attempt) Unsuccessful Call (1st Attempt) Date: 05/27/22  Attempted to reach the patient regarding the most recent Inpatient/ED visit.  Follow Up Plan: Additional outreach attempts will be made to reach the patient to complete the Transitions of Care (Post Inpatient/ED visit) call.   Signature  Angeline Slim, Therapist, sports, BSN

## 2022-05-28 ENCOUNTER — Telehealth: Payer: Self-pay

## 2022-05-28 NOTE — Transitions of Care (Post Inpatient/ED Visit) (Signed)
   05/28/2022  Name: Tiffany Martin MRN: [id number] DOB: 1961/01/30  Today's TOC FU Call Status: Today's TOC FU Call Status:: Successful TOC FU Call Competed TOC FU Call Complete Date: 05/28/22  Transition Care Management Follow-up Telephone Call Discharge Facility: [facility]) Type of Discharge: Emergency Department Reason for ED Visit: Other: ("allergic reaction") How have you been since you were released from the hospital?: Better Any questions or concerns?: No  Items Reviewed: Did you receive and understand the discharge instructions provided?: Yes Medications obtained and verified?: Yes (Medications Reviewed) Any new allergies since your discharge?: No Dietary orders reviewed?: NA Do you have support at home?: Yes People in Home: parent(s) Name of Support/Comfort Primary Source: mom  Home Care and Equipment/Supplies: [place] Ordered?: No Any new equipment or medical supplies ordered?: No  Functional Questionnaire: Do you need assistance with bathing/showering or dressing?: No Do you need assistance with meal preparation?: No Do you need assistance with eating?: No Do you have difficulty maintaining continence: No Do you need assistance with getting out of bed/getting out of a chair/moving?: No Do you have difficulty managing or taking your medications?: No  Folllow up appointments reviewed: PCP Follow-up appointment confirmed?: Yes Date of PCP follow-up appointment?: 06/22/22 Follow-up Provider: Dr. Ethelene Hal Specialist [facility] Follow-up appointment confirmed?: Yes Date of Specialist follow-up appointment?: 06/04/22 Follow-Up Specialty Provider:: Dr. Nelva Bush Do you need transportation to your follow-up appointment?: No Do you understand care options if your condition(s) worsen?: Yes-patient verbalized understanding  SDOH Interventions Today    Flowsheet Row Most Recent Value  SDOH Interventions   Food Insecurity Interventions Intervention Not  Indicated  Transportation Interventions Intervention Not Indicated      TOC Interventions Today    Flowsheet Row Most Recent Value  TOC Interventions   TOC Interventions Discussed/Reviewed TOC Interventions Discussed, Post discharge activity limitations per provider       Interventions Today    Flowsheet Row Most Recent Value  Education Interventions   Education Provided Provided Education  Provided Verbal Education On Nutrition, When to see the doctor, Medication  Nutrition Interventions   Nutrition Discussed/Reviewed Nutrition Discussed  Pharmacy Interventions   Pharmacy Dicussed/Reviewed Pharmacy Topics Discussed, Medications and their functions  Safety Interventions   Safety Discussed/Reviewed Safety Discussed       Hetty Blend [facility] Care Management Care Management Community Coordinator Direct Phone: [phone] Toll Free: [phone] Fax: [phone]

## 2022-05-31 NOTE — Transitions of Care (Post Inpatient/ED Visit) (Signed)
   05/31/2022  Name: Tiffany Martin MRN: [id number] DOB: 10/19/1960  Today's TOC FU Call Status: Today's TOC FU Call Status:: Unsuccessul Call (1st Attempt) Unsuccessful Call (1st Attempt) Date: 05/27/22 [facility] FU Call Complete Date: 05/28/22  Attempted to reach the patient regarding the most recent Inpatient/ED visit.  Follow Up Plan: No further outreach attempts will be made at this time. We have been unable to contact the patient. [facility] has already completed the [facility] call.  Signature Angeline Slim, Therapist, sports, BSN

## 2022-06-04 ENCOUNTER — Ambulatory Visit: Payer: Federal, State, Local not specified - PPO | Admitting: Allergy

## 2022-06-09 DIAGNOSIS — M47816 Spondylosis without myelopathy or radiculopathy, lumbar region: Secondary | ICD-10-CM | POA: Diagnosis not present

## 2022-06-09 DIAGNOSIS — G894 Chronic pain syndrome: Secondary | ICD-10-CM | POA: Diagnosis not present

## 2022-06-09 DIAGNOSIS — M47812 Spondylosis without myelopathy or radiculopathy, cervical region: Secondary | ICD-10-CM | POA: Diagnosis not present

## 2022-06-09 DIAGNOSIS — M5416 Radiculopathy, lumbar region: Secondary | ICD-10-CM | POA: Diagnosis not present

## 2022-06-22 ENCOUNTER — Encounter: Payer: Self-pay | Admitting: Family Medicine

## 2022-06-22 ENCOUNTER — Ambulatory Visit: Payer: Federal, State, Local not specified - PPO | Admitting: Family Medicine

## 2022-06-22 VITALS — BP 118/66 | HR 72 | Temp 97.3°F | Ht 66.0 in | Wt 219.0 lb

## 2022-06-22 DIAGNOSIS — Z6835 Body mass index (BMI) 35.0-35.9, adult: Secondary | ICD-10-CM

## 2022-06-22 DIAGNOSIS — I10 Essential (primary) hypertension: Secondary | ICD-10-CM

## 2022-06-22 DIAGNOSIS — R7309 Other abnormal glucose: Secondary | ICD-10-CM

## 2022-06-22 DIAGNOSIS — D561 Beta thalassemia: Secondary | ICD-10-CM | POA: Diagnosis not present

## 2022-06-22 DIAGNOSIS — E559 Vitamin D deficiency, unspecified: Secondary | ICD-10-CM | POA: Diagnosis not present

## 2022-06-22 DIAGNOSIS — Z114 Encounter for screening for human immunodeficiency virus [HIV]: Secondary | ICD-10-CM | POA: Diagnosis not present

## 2022-06-22 DIAGNOSIS — E611 Iron deficiency: Secondary | ICD-10-CM | POA: Diagnosis not present

## 2022-06-22 LAB — CBC
HCT: 35.2 % — ABNORMAL LOW (ref 36.0–46.0)
Hemoglobin: 11.3 g/dL — ABNORMAL LOW (ref 12.0–15.0)
MCHC: 32 g/dL (ref 30.0–36.0)
MCV: 70.9 fl — ABNORMAL LOW (ref 78.0–100.0)
Platelets: 328 10*3/uL (ref 150.0–400.0)
RBC: 4.97 Mil/uL (ref 3.87–5.11)
RDW: 17.9 % — ABNORMAL HIGH (ref 11.5–15.5)
WBC: 5.5 10*3/uL (ref 4.0–10.5)

## 2022-06-22 LAB — HEMOGLOBIN A1C: Hgb A1c MFr Bld: 5.9 % (ref 4.6–6.5)

## 2022-06-22 LAB — BASIC METABOLIC PANEL
BUN: 14 mg/dL (ref 6–23)
CO2: 28 mEq/L (ref 19–32)
Calcium: 9.3 mg/dL (ref 8.4–10.5)
Chloride: 103 mEq/L (ref 96–112)
Creatinine, Ser: 0.88 mg/dL (ref 0.40–1.20)
GFR: 69.98 mL/min (ref >60.00)
Glucose, Bld: 86 mg/dL (ref 70–99)
Potassium: 4.1 mEq/L (ref 3.5–5.1)
Sodium: 142 mEq/L (ref 135–145)

## 2022-06-22 LAB — VITAMIN D 25 HYDROXY (VIT D DEFICIENCY, FRACTURES): VITD: 34.78 ng/mL (ref 30.00–100.00)

## 2022-06-22 NOTE — Progress Notes (Unsigned)
Established Patient Office Visit   Subjective:  Patient ID: Tiffany Martin, female    DOB: [birth year]  Age: 62 y.o. MRN: [id number]  Chief Complaint  Patient presents with   Medical Management of Chronic Issues    6 month follow up, no concerns. Patient fasting.     HPI Encounter Diagnoses  Name Primary?   BMI 35.0-35.9,adult Yes   Elevated glucose    Essential hypertension    Iron deficiency    Beta thalassemia (HCC)    Morbid obesity (HCC)    Vitamin D deficiency    Screening for HIV (human immunodeficiency virus)    Follow-up of above.  Doing well.  Is embarked on a weight loss plan with the "go low" program.  She is starting water aerobics and walking with a friend.  Continues Azor and carvedilol for hypertension.  Continues iron therapy every other day. {History (Optional):23778}  Review of Systems  Constitutional: Negative.   HENT: Negative.    Eyes:  Negative for blurred vision, discharge and redness.  Respiratory: Negative.    Cardiovascular: Negative.   Gastrointestinal:  Negative for abdominal pain.  Genitourinary: Negative.   Musculoskeletal:  Positive for back pain. Negative for myalgias.  Skin:  Negative for rash.  Neurological:  Negative for tingling, loss of consciousness and weakness.  Endo/Heme/Allergies:  Negative for polydipsia.     Current Outpatient Medications:    albuterol (VENTOLIN HFA) 108 (90 Base) MCG/ACT inhaler, INHALE 2 PUFFS INTO THE LUNGS EVERY 6 HOURS AS NEEDED FOR WHEEZING OR SHORTNESS OF BREATH, Disp: 54 g, Rfl: 1   amlodipine-olmesartan (AZOR) 10-20 MG tablet, TAKE 1 TABLET BY MOUTH DAILY, Disp: 90 tablet, Rfl: 1   aspirin 81 MG chewable tablet, Chew 1 tablet by mouth daily., Disp: , Rfl:    B Complex-Biotin-FA (B-100 COMPLEX PO), Take 1 capsule by mouth daily., Disp: , Rfl:    carvedilol (COREG) 25 MG tablet, TAKE 1 TABLET(25 MG) BY MOUTH TWICE DAILY, Disp: 180 tablet, Rfl: 2   DIGESTIVE ENZYMES PO, Take 1 capsule by mouth 2 (two)  times daily with a meal., Disp: , Rfl:    diphenhydrAMINE (BENADRYL) 25 MG tablet, Take 1 tablet (25 mg total) by mouth every 6 (six) hours as needed., Disp: 30 tablet, Rfl: 0   DULoxetine (CYMBALTA) 60 MG capsule, Take 1 capsule (60 mg total) by mouth daily., Disp: 90 capsule, Rfl: 1   EPINEPHrine 0.3 mg/0.3 mL IJ SOAJ injection, Inject into the muscle., Disp: , Rfl:    ferrous sulfate 325 (65 FE) MG tablet, Take 1 tablet by mouth daily., Disp: , Rfl:    fluticasone (FLOVENT HFA) 110 MCG/ACT inhaler, Inhale 2 puffs into the lungs 2 (two) times daily. With spacer, Disp: 12 g, Rfl: 5   gabapentin (NEURONTIN) 600 MG tablet, Take 1 tablet (600 mg total) by mouth 3 (three) times daily., Disp: 270 tablet, Rfl: 1   hydrOXYzine (VISTARIL) 25 MG capsule, Take 25-50 mg by mouth at bedtime as needed., Disp: , Rfl:    Olopatadine HCl 0.2 % SOLN, Place 1 drop into each eye daily as needed for itchy, watery eyes., Disp: 2.5 mL, Rfl: 1   Olopatadine-Mometasone 665-25 MCG/ACT SUSP, Place 2 sprays into the nose as needed. Place 2 (two) sprays per nostril 1-2 times daily as needed., Disp: 29 g, Rfl: 1   Omeprazole 20 MG TBEC, Take 1 tablet by mouth daily., Disp: , Rfl:    polyethylene glycol (MIRALAX) 17 g packet, Take as directed once  to twice daily, Disp: 14 each, Rfl: 0   SUMAtriptan (IMITREX) 50 MG tablet, Take 1 tablet (50 mg total) by mouth every 2 (two) hours as needed for migraine. May repeat once in 2 hours if headache persists or recurs., Disp: 10 tablet, Rfl: 2   tiZANidine (ZANAFLEX) 4 MG tablet, Take 4 mg by mouth 3 (three) times daily as needed., Disp: , Rfl:    traMADol (ULTRAM) 50 MG tablet, Take 2 tablets (100 mg total) by mouth every 8 (eight) hours as needed., Disp: 90 tablet, Rfl: 0   vitamin C (ASCORBIC ACID) 500 MG tablet, Take 500 mg by mouth daily., Disp: , Rfl:    acetaminophen (TYLENOL) 500 MG tablet, Take 1,000 mg by mouth every 8 (eight) hours as needed. (Patient not taking: Reported on  06/22/2022), Disp: , Rfl:    Cholecalciferol (VITAMIN D3) 25 MCG (1000 UT) CAPS, Take 1 capsule by mouth daily. (Patient not taking: Reported on 06/22/2022), Disp: , Rfl:    Tenapanor HCl (IBSRELA) 50 MG TABS, Take 50 mg by mouth daily., Disp: 12 tablet, Rfl: 0   Objective:     BP 118/66 (BP Location: Right Arm, Patient Position: Sitting, Cuff Size: Large)   Pulse 72   Temp (!) 97.3 F (36.3 C) (Temporal)   Ht 5\' 6"  (1.676 m)   Wt 219 lb (99.3 kg)   SpO2 93%   BMI 35.35 kg/m  Wt Readings from Last 3 Encounters:  06/22/22 219 lb (99.3 kg)  05/25/22 216 lb 0.8 oz (98 kg)  05/25/22 216 lb 3.2 oz (98.1 kg)      Physical Exam Constitutional:      General: She is not in acute distress.    Appearance: Normal appearance. She is not ill-appearing, toxic-appearing or diaphoretic.  HENT:     Head: Normocephalic and atraumatic.     Right Ear: External ear normal.     Left Ear: External ear normal.  Eyes:     General: No scleral icterus.       Right eye: No discharge.        Left eye: No discharge.     Extraocular Movements: Extraocular movements intact.     Conjunctiva/sclera: Conjunctivae normal.  Pulmonary:     Effort: Pulmonary effort is normal. No respiratory distress.  Skin:    General: Skin is warm and dry.  Neurological:     Mental Status: She is alert and oriented to person, place, and time.  Psychiatric:        Mood and Affect: Mood normal.        Behavior: Behavior normal.      No results found for any visits on 06/22/22.  {Labs (Optional):23779}  The ASCVD Risk score (Arnett DK, et al., 2019) failed to calculate for the following reasons:   Cannot find a previous HDL lab   Cannot find a previous total cholesterol lab    Assessment & Plan:   BMI 35.0-35.9,adult  Elevated glucose -     Hemoglobin A1c -     Basic metabolic panel  Essential hypertension -     Basic metabolic panel  Iron deficiency -     CBC -     Iron, TIBC and Ferritin Panel  Beta  thalassemia (HCC) -     CBC  Morbid obesity (HCC)  Vitamin D deficiency -     VITAMIN D 25 Hydroxy (Vit-D Deficiency, Fractures)  Screening for HIV (human immunodeficiency virus) -     HIV Antibody (  routine testing w rflx)    Return in about 6 months (around 12/23/2022), or if symptoms worsen or fail to improve.  Applauded new weight loss efforts with increased exercise including water aerobics.  Discussed using Glucophage if hemoglobin A1c elevates.  Information was given on exercising to lose weight.  HIV and iron levels were not checked at last visit.  Libby Maw, MD

## 2022-06-23 LAB — IRON,TIBC AND FERRITIN PANEL
%SAT: 28 % (calc) (ref 16–45)
Ferritin: 114 ng/mL (ref 16–288)
Iron: 88 ug/dL (ref 45–160)
TIBC: 310 mcg/dL (calc) (ref 250–450)

## 2022-06-23 LAB — HIV ANTIBODY (ROUTINE TESTING W REFLEX): HIV 1&2 Ab, 4th Generation: NONREACTIVE

## 2022-06-24 MED ORDER — METFORMIN HCL ER 500 MG PO TB24: 500.0000 mg | ORAL_TABLET | Freq: Every evening | ORAL | 1 refills | Status: DC

## 2022-07-19 ENCOUNTER — Encounter: Payer: Self-pay | Admitting: *Deleted

## 2022-07-21 DIAGNOSIS — M47816 Spondylosis without myelopathy or radiculopathy, lumbar region: Secondary | ICD-10-CM | POA: Diagnosis not present

## 2022-07-21 DIAGNOSIS — G894 Chronic pain syndrome: Secondary | ICD-10-CM | POA: Diagnosis not present

## 2022-07-21 DIAGNOSIS — M5416 Radiculopathy, lumbar region: Secondary | ICD-10-CM | POA: Diagnosis not present

## 2022-07-21 DIAGNOSIS — M47812 Spondylosis without myelopathy or radiculopathy, cervical region: Secondary | ICD-10-CM | POA: Diagnosis not present

## 2022-08-18 DIAGNOSIS — M5416 Radiculopathy, lumbar region: Secondary | ICD-10-CM | POA: Diagnosis not present

## 2022-08-18 DIAGNOSIS — G894 Chronic pain syndrome: Secondary | ICD-10-CM | POA: Diagnosis not present

## 2022-08-18 DIAGNOSIS — M47816 Spondylosis without myelopathy or radiculopathy, lumbar region: Secondary | ICD-10-CM | POA: Diagnosis not present

## 2022-08-18 DIAGNOSIS — M47812 Spondylosis without myelopathy or radiculopathy, cervical region: Secondary | ICD-10-CM | POA: Diagnosis not present

## 2022-08-25 ENCOUNTER — Other Ambulatory Visit: Payer: Self-pay | Admitting: Family Medicine

## 2022-08-25 DIAGNOSIS — Z0000 Encounter for general adult medical examination without abnormal findings: Secondary | ICD-10-CM

## 2022-09-14 ENCOUNTER — Other Ambulatory Visit: Payer: Self-pay | Admitting: Allergy

## 2022-09-14 DIAGNOSIS — M47816 Spondylosis without myelopathy or radiculopathy, lumbar region: Secondary | ICD-10-CM | POA: Diagnosis not present

## 2022-09-15 DIAGNOSIS — G894 Chronic pain syndrome: Secondary | ICD-10-CM | POA: Diagnosis not present

## 2022-09-15 DIAGNOSIS — M47816 Spondylosis without myelopathy or radiculopathy, lumbar region: Secondary | ICD-10-CM | POA: Diagnosis not present

## 2022-09-15 DIAGNOSIS — M5416 Radiculopathy, lumbar region: Secondary | ICD-10-CM | POA: Diagnosis not present

## 2022-09-15 DIAGNOSIS — M47812 Spondylosis without myelopathy or radiculopathy, cervical region: Secondary | ICD-10-CM | POA: Diagnosis not present

## 2022-09-28 DIAGNOSIS — M47816 Spondylosis without myelopathy or radiculopathy, lumbar region: Secondary | ICD-10-CM | POA: Diagnosis not present

## 2022-10-01 ENCOUNTER — Ambulatory Visit
Admission: RE | Admit: 2022-10-01 | Discharge: 2022-10-01 | Disposition: A | Discharge status: Home / Self Care | Payer: Federal, State, Local not specified - PPO | Source: Ambulatory Visit | Attending: Family Medicine | Admitting: Family Medicine

## 2022-10-01 DIAGNOSIS — Z0000 Encounter for general adult medical examination without abnormal findings: Secondary | ICD-10-CM

## 2022-10-01 DIAGNOSIS — Z1231 Encounter for screening mammogram for malignant neoplasm of breast: Secondary | ICD-10-CM | POA: Diagnosis not present

## 2022-10-12 DIAGNOSIS — Z713 Dietary counseling and surveillance: Secondary | ICD-10-CM | POA: Diagnosis not present

## 2022-10-13 DIAGNOSIS — G894 Chronic pain syndrome: Secondary | ICD-10-CM | POA: Diagnosis not present

## 2022-10-13 DIAGNOSIS — M47812 Spondylosis without myelopathy or radiculopathy, cervical region: Secondary | ICD-10-CM | POA: Diagnosis not present

## 2022-10-13 DIAGNOSIS — Z79891 Long term (current) use of opiate analgesic: Secondary | ICD-10-CM | POA: Diagnosis not present

## 2022-10-13 DIAGNOSIS — M5416 Radiculopathy, lumbar region: Secondary | ICD-10-CM | POA: Diagnosis not present

## 2022-10-13 DIAGNOSIS — M47816 Spondylosis without myelopathy or radiculopathy, lumbar region: Secondary | ICD-10-CM | POA: Diagnosis not present

## 2022-10-15 ENCOUNTER — Other Ambulatory Visit: Payer: Self-pay | Admitting: Family Medicine

## 2022-10-15 DIAGNOSIS — I10 Essential (primary) hypertension: Secondary | ICD-10-CM

## 2022-10-29 DIAGNOSIS — Z713 Dietary counseling and surveillance: Secondary | ICD-10-CM | POA: Diagnosis not present

## 2022-11-10 DIAGNOSIS — G894 Chronic pain syndrome: Secondary | ICD-10-CM | POA: Diagnosis not present

## 2022-11-10 DIAGNOSIS — M47816 Spondylosis without myelopathy or radiculopathy, lumbar region: Secondary | ICD-10-CM | POA: Diagnosis not present

## 2022-11-10 DIAGNOSIS — M47812 Spondylosis without myelopathy or radiculopathy, cervical region: Secondary | ICD-10-CM | POA: Diagnosis not present

## 2022-11-10 DIAGNOSIS — M5416 Radiculopathy, lumbar region: Secondary | ICD-10-CM | POA: Diagnosis not present

## 2022-11-17 DIAGNOSIS — M47816 Spondylosis without myelopathy or radiculopathy, lumbar region: Secondary | ICD-10-CM | POA: Diagnosis not present

## 2022-11-18 ENCOUNTER — Encounter (INDEPENDENT_AMBULATORY_CARE_PROVIDER_SITE_OTHER): Payer: Self-pay

## 2022-12-16 DIAGNOSIS — G894 Chronic pain syndrome: Secondary | ICD-10-CM | POA: Diagnosis not present

## 2022-12-16 DIAGNOSIS — M5416 Radiculopathy, lumbar region: Secondary | ICD-10-CM | POA: Diagnosis not present

## 2022-12-16 DIAGNOSIS — M47816 Spondylosis without myelopathy or radiculopathy, lumbar region: Secondary | ICD-10-CM | POA: Diagnosis not present

## 2022-12-16 DIAGNOSIS — M47812 Spondylosis without myelopathy or radiculopathy, cervical region: Secondary | ICD-10-CM | POA: Diagnosis not present

## 2022-12-23 ENCOUNTER — Encounter: Payer: Self-pay | Admitting: Family Medicine

## 2022-12-23 ENCOUNTER — Ambulatory Visit: Payer: Federal, State, Local not specified - PPO | Admitting: Family Medicine

## 2022-12-23 VITALS — BP 118/84 | HR 73 | Temp 97.2°F | Ht 66.0 in | Wt 214.0 lb

## 2022-12-23 DIAGNOSIS — Z6834 Body mass index (BMI) 34.0-34.9, adult: Secondary | ICD-10-CM

## 2022-12-23 DIAGNOSIS — R7303 Prediabetes: Secondary | ICD-10-CM | POA: Diagnosis not present

## 2022-12-23 DIAGNOSIS — Z23 Encounter for immunization: Secondary | ICD-10-CM

## 2022-12-23 DIAGNOSIS — E538 Deficiency of other specified B group vitamins: Secondary | ICD-10-CM

## 2022-12-23 DIAGNOSIS — Z13220 Encounter for screening for lipoid disorders: Secondary | ICD-10-CM | POA: Diagnosis not present

## 2022-12-23 DIAGNOSIS — I10 Essential (primary) hypertension: Secondary | ICD-10-CM | POA: Diagnosis not present

## 2022-12-23 LAB — BASIC METABOLIC PANEL
BUN: 9 mg/dL (ref 6–23)
CO2: 31 mEq/L (ref 19–32)
Calcium: 9.6 mg/dL (ref 8.4–10.5)
Chloride: 104 mEq/L (ref 96–112)
Creatinine, Ser: 0.84 mg/dL (ref 0.40–1.20)
GFR: 73.74 mL/min (ref >60.00)
Glucose, Bld: 100 mg/dL — ABNORMAL HIGH (ref 70–99)
Potassium: 4.6 mEq/L (ref 3.5–5.1)
Sodium: 143 mEq/L (ref 135–145)

## 2022-12-23 LAB — LIPID PANEL
Cholesterol: 139 mg/dL (ref 0–200)
HDL: 50.7 mg/dL (ref >39.00)
LDL Cholesterol: 64 mg/dL (ref 0–99)
NonHDL: 88.74
Total CHOL/HDL Ratio: 3
Triglycerides: 122 mg/dL (ref 0.0–149.0)
VLDL: 24.4 mg/dL (ref 0.0–40.0)

## 2022-12-23 LAB — URINALYSIS, ROUTINE W REFLEX MICROSCOPIC
Hgb urine dipstick: NEGATIVE
Ketones, ur: NEGATIVE
Leukocytes,Ua: NEGATIVE
Nitrite: NEGATIVE
Specific Gravity, Urine: 1.015 (ref 1.000–1.030)
Total Protein, Urine: NEGATIVE
Urine Glucose: NEGATIVE
Urobilinogen, UA: 1 (ref 0.0–1.0)
pH: 7.5 (ref 5.0–8.0)

## 2022-12-23 LAB — HEMOGLOBIN A1C: Hgb A1c MFr Bld: 5.8 % (ref 4.6–6.5)

## 2022-12-23 LAB — VITAMIN B12: Vitamin B-12: 489 pg/mL (ref 211–911)

## 2022-12-23 NOTE — Progress Notes (Signed)
Established Patient Office Visit   Subjective:  Patient ID: Tiffany Martin, female    DOB: 1960/10/17  Age: 62 y.o. MRN: [id number]  Chief Complaint  Patient presents with   Medical Management of Chronic Issues    6 month follow up. Pt is fasting.     HPI Encounter Diagnoses  Name Primary?   Prediabetes Yes   Essential hypertension    Need for immunization against influenza    Screening for cholesterol level    Morbid obesity (HCC)    B12 deficiency    Doing well.  No problems taking the metformin.  She is working with a Control and instrumentation engineer who is encouraging her to exercise daily, especially after eating.  Fasting sugars have been in the less than 130 and greater than 90.  Blood pressure well-controlled with Azor and carvedilol.  This past pain medicine okay   Review of Systems  Constitutional: Negative.   HENT: Negative.    Eyes:  Negative for blurred vision, discharge and redness.  Respiratory: Negative.    Cardiovascular: Negative.   Gastrointestinal:  Negative for abdominal pain.  Genitourinary: Negative.   Musculoskeletal: Negative.  Negative for myalgias.  Skin:  Negative for rash.  Neurological:  Negative for tingling, loss of consciousness and weakness.  Endo/Heme/Allergies:  Negative for polydipsia.     Current Outpatient Medications:    acetaminophen (TYLENOL) 500 MG tablet, Take 1,000 mg by mouth every 8 (eight) hours as needed., Disp: , Rfl:    albuterol (VENTOLIN HFA) 108 (90 Base) MCG/ACT inhaler, INHALE 2 PUFFS INTO THE LUNGS EVERY 6 HOURS AS NEEDED FOR WHEEZING OR SHORTNESS OF BREATH, Disp: 54 g, Rfl: 1   amlodipine-olmesartan (AZOR) 10-20 MG tablet, TAKE 1 TABLET BY MOUTH DAILY, Disp: 90 tablet, Rfl: 1   aspirin 81 MG chewable tablet, Chew 1 tablet by mouth daily., Disp: , Rfl:    B Complex-Biotin-FA (B-100 COMPLEX PO), Take 1 capsule by mouth daily., Disp: , Rfl:    carvedilol (COREG) 25 MG tablet, TAKE 1 TABLET(25 MG) BY MOUTH TWICE DAILY, Disp: 180 tablet,  Rfl: 2   Cholecalciferol (VITAMIN D3) 25 MCG (1000 UT) CAPS, Take 1 capsule by mouth daily., Disp: , Rfl:    DIGESTIVE ENZYMES PO, Take 1 capsule by mouth 2 (two) times daily with a meal., Disp: , Rfl:    diphenhydrAMINE (BENADRYL) 25 MG tablet, Take 1 tablet (25 mg total) by mouth every 6 (six) hours as needed., Disp: 30 tablet, Rfl: 0   DULoxetine (CYMBALTA) 60 MG capsule, Take 1 capsule (60 mg total) by mouth daily., Disp: 90 capsule, Rfl: 1   EPINEPHrine 0.3 mg/0.3 mL IJ SOAJ injection, Inject into the muscle., Disp: , Rfl:    ferrous sulfate 325 (65 FE) MG tablet, Take 1 tablet by mouth daily., Disp: , Rfl:    fluticasone (FLOVENT HFA) 110 MCG/ACT inhaler, Inhale 2 puffs into the lungs 2 (two) times daily. With spacer, Disp: 12 g, Rfl: 5   gabapentin (NEURONTIN) 600 MG tablet, Take 1 tablet (600 mg total) by mouth 3 (three) times daily., Disp: 270 tablet, Rfl: 1   hydrOXYzine (VISTARIL) 25 MG capsule, Take 25-50 mg by mouth at bedtime as needed., Disp: , Rfl:    metFORMIN (GLUCOPHAGE-XR) 500 MG 24 hr tablet, Take 1 tablet (500 mg total) by mouth at bedtime., Disp: 90 tablet, Rfl: 1   Omeprazole 20 MG TBEC, Take 1 tablet by mouth daily., Disp: , Rfl:    polyethylene glycol (MIRALAX) 17 g packet,  Take as directed once to twice daily, Disp: 14 each, Rfl: 0   SUMAtriptan (IMITREX) 50 MG tablet, Take 1 tablet (50 mg total) by mouth every 2 (two) hours as needed for migraine. May repeat once in 2 hours if headache persists or recurs., Disp: 10 tablet, Rfl: 2   tiZANidine (ZANAFLEX) 4 MG tablet, Take 4 mg by mouth 3 (three) times daily as needed., Disp: , Rfl:    traMADol (ULTRAM) 50 MG tablet, Take 2 tablets (100 mg total) by mouth every 8 (eight) hours as needed., Disp: 90 tablet, Rfl: 0   vitamin C (ASCORBIC ACID) 500 MG tablet, Take 500 mg by mouth daily., Disp: , Rfl:    Olopatadine HCl 0.2 % SOLN, Place 1 drop into each eye daily as needed for itchy, watery eyes. (Patient not taking: Reported on  12/23/2022), Disp: 2.5 mL, Rfl: 1   Olopatadine-Mometasone 665-25 MCG/ACT SUSP, Place 2 sprays into the nose as needed. Place 2 (two) sprays per nostril 1-2 times daily as needed. (Patient not taking: Reported on 12/23/2022), Disp: 29 g, Rfl: 1   Tenapanor HCl (IBSRELA) 50 MG TABS, Take 50 mg by mouth daily., Disp: 12 tablet, Rfl: 0   Objective:     BP 118/84   Pulse 73   Temp (!) 97.2 F (36.2 C)   Ht 5\' 6"  (1.676 m)   Wt 214 lb (97.1 kg)   SpO2 92%   BMI 34.54 kg/m  Wt Readings from Last 3 Encounters:  12/23/22 214 lb (97.1 kg)  06/22/22 219 lb (99.3 kg)  05/25/22 216 lb 0.8 oz (98 kg)      Physical Exam Constitutional:      General: She is not in acute distress.    Appearance: Normal appearance. She is not ill-appearing, toxic-appearing or diaphoretic.  HENT:     Head: Normocephalic and atraumatic.     Right Ear: External ear normal.     Left Ear: External ear normal.  Eyes:     General: No scleral icterus.       Right eye: No discharge.        Left eye: No discharge.     Extraocular Movements: Extraocular movements intact.     Conjunctiva/sclera: Conjunctivae normal.  Pulmonary:     Effort: Pulmonary effort is normal. No respiratory distress.  Skin:    General: Skin is warm and dry.  Neurological:     Mental Status: She is alert and oriented to person, place, and time.  Psychiatric:        Mood and Affect: Mood normal.        Behavior: Behavior normal.      No results found for any visits on 12/23/22.    The ASCVD Risk score (Arnett DK, et al., 2019) failed to calculate for the following reasons:   Cannot find a previous HDL lab   Cannot find a previous total cholesterol lab    Assessment & Plan:   Prediabetes -     Basic metabolic panel -     Hemoglobin A1c  Essential hypertension -     Urinalysis, Routine w reflex microscopic  Need for immunization against influenza -     Flu Vaccine QUAD 34mo+IM (Fluarix, Fluzone & Alfiuria Quad  PF)  Screening for cholesterol level -     Lipid panel  Morbid obesity (HCC)  B12 deficiency -     Vitamin B12    Return in about 6 months (around 06/22/2023).  Continue exercising with weight  loss efforts.  Information given on exercising to lose weight.   Mliss Sax, MD

## 2022-12-25 ENCOUNTER — Other Ambulatory Visit: Payer: Self-pay | Admitting: Family Medicine

## 2022-12-25 DIAGNOSIS — R7309 Other abnormal glucose: Secondary | ICD-10-CM

## 2023-01-12 ENCOUNTER — Encounter: Payer: Self-pay | Admitting: Family Medicine

## 2023-01-13 ENCOUNTER — Other Ambulatory Visit: Payer: Self-pay | Admitting: Family Medicine

## 2023-01-13 DIAGNOSIS — G894 Chronic pain syndrome: Secondary | ICD-10-CM | POA: Diagnosis not present

## 2023-01-13 DIAGNOSIS — M47812 Spondylosis without myelopathy or radiculopathy, cervical region: Secondary | ICD-10-CM | POA: Diagnosis not present

## 2023-01-13 DIAGNOSIS — M5416 Radiculopathy, lumbar region: Secondary | ICD-10-CM | POA: Diagnosis not present

## 2023-01-13 DIAGNOSIS — M47816 Spondylosis without myelopathy or radiculopathy, lumbar region: Secondary | ICD-10-CM | POA: Diagnosis not present

## 2023-01-13 DIAGNOSIS — I10 Essential (primary) hypertension: Secondary | ICD-10-CM

## 2023-01-23 ENCOUNTER — Other Ambulatory Visit: Payer: Self-pay | Admitting: Family

## 2023-01-23 DIAGNOSIS — I10 Essential (primary) hypertension: Secondary | ICD-10-CM

## 2023-01-31 ENCOUNTER — Other Ambulatory Visit: Payer: Self-pay

## 2023-01-31 MED ORDER — ALBUTEROL SULFATE HFA 108 (90 BASE) MCG/ACT IN AERS: 2.0000 | INHALATION_SPRAY | Freq: Four times a day (QID) | RESPIRATORY_TRACT | 1 refills | Status: DC | PRN

## 2023-02-10 DIAGNOSIS — M47812 Spondylosis without myelopathy or radiculopathy, cervical region: Secondary | ICD-10-CM | POA: Diagnosis not present

## 2023-02-10 DIAGNOSIS — M5416 Radiculopathy, lumbar region: Secondary | ICD-10-CM | POA: Diagnosis not present

## 2023-02-10 DIAGNOSIS — M47816 Spondylosis without myelopathy or radiculopathy, lumbar region: Secondary | ICD-10-CM | POA: Diagnosis not present

## 2023-02-10 DIAGNOSIS — G894 Chronic pain syndrome: Secondary | ICD-10-CM | POA: Diagnosis not present

## 2023-02-15 DIAGNOSIS — Z713 Dietary counseling and surveillance: Secondary | ICD-10-CM | POA: Diagnosis not present

## 2023-03-10 DIAGNOSIS — M5416 Radiculopathy, lumbar region: Secondary | ICD-10-CM | POA: Diagnosis not present

## 2023-03-10 DIAGNOSIS — G894 Chronic pain syndrome: Secondary | ICD-10-CM | POA: Diagnosis not present

## 2023-03-10 DIAGNOSIS — M47816 Spondylosis without myelopathy or radiculopathy, lumbar region: Secondary | ICD-10-CM | POA: Diagnosis not present

## 2023-03-10 DIAGNOSIS — M47812 Spondylosis without myelopathy or radiculopathy, cervical region: Secondary | ICD-10-CM | POA: Diagnosis not present

## 2023-04-21 DIAGNOSIS — M47816 Spondylosis without myelopathy or radiculopathy, lumbar region: Secondary | ICD-10-CM | POA: Diagnosis not present

## 2023-04-21 DIAGNOSIS — G894 Chronic pain syndrome: Secondary | ICD-10-CM | POA: Diagnosis not present

## 2023-04-21 DIAGNOSIS — M5416 Radiculopathy, lumbar region: Secondary | ICD-10-CM | POA: Diagnosis not present

## 2023-04-21 DIAGNOSIS — M47812 Spondylosis without myelopathy or radiculopathy, cervical region: Secondary | ICD-10-CM | POA: Diagnosis not present

## 2023-04-28 DIAGNOSIS — Z713 Dietary counseling and surveillance: Secondary | ICD-10-CM | POA: Diagnosis not present

## 2023-05-04 ENCOUNTER — Telehealth: Payer: Self-pay | Admitting: Family Medicine

## 2023-05-04 NOTE — Telephone Encounter (Signed)
Patient is having oral surgery and has to have all her teeth pulled out she needs a medical clearance form filled out she stated the dentist office has faxed over the form she would like a call back

## 2023-05-04 NOTE — Telephone Encounter (Signed)
Paperwork was collected and placed in Dr. Doreene Burke MD provider sign/review folder.

## 2023-05-06 NOTE — Telephone Encounter (Signed)
I called patient and left her a detailed message that Dr. Doreene Burke filled out form and that I faxed to dental office and a copy will be placed in her chart.

## 2023-05-25 DIAGNOSIS — M47816 Spondylosis without myelopathy or radiculopathy, lumbar region: Secondary | ICD-10-CM | POA: Diagnosis not present

## 2023-05-25 DIAGNOSIS — M5416 Radiculopathy, lumbar region: Secondary | ICD-10-CM | POA: Diagnosis not present

## 2023-05-25 DIAGNOSIS — M47812 Spondylosis without myelopathy or radiculopathy, cervical region: Secondary | ICD-10-CM | POA: Diagnosis not present

## 2023-05-25 DIAGNOSIS — G894 Chronic pain syndrome: Secondary | ICD-10-CM | POA: Diagnosis not present

## 2023-06-15 DIAGNOSIS — M5416 Radiculopathy, lumbar region: Secondary | ICD-10-CM | POA: Diagnosis not present

## 2023-06-15 DIAGNOSIS — M47812 Spondylosis without myelopathy or radiculopathy, cervical region: Secondary | ICD-10-CM | POA: Diagnosis not present

## 2023-06-15 DIAGNOSIS — M47816 Spondylosis without myelopathy or radiculopathy, lumbar region: Secondary | ICD-10-CM | POA: Diagnosis not present

## 2023-06-15 DIAGNOSIS — G894 Chronic pain syndrome: Secondary | ICD-10-CM | POA: Diagnosis not present

## 2023-06-16 ENCOUNTER — Other Ambulatory Visit: Payer: Self-pay | Admitting: Family Medicine

## 2023-06-16 DIAGNOSIS — I10 Essential (primary) hypertension: Secondary | ICD-10-CM

## 2023-07-04 ENCOUNTER — Other Ambulatory Visit: Payer: Self-pay | Admitting: Physical Medicine and Rehabilitation

## 2023-07-04 DIAGNOSIS — M5412 Radiculopathy, cervical region: Secondary | ICD-10-CM

## 2023-07-07 ENCOUNTER — Ambulatory Visit
Admission: RE | Admit: 2023-07-07 | Discharge: 2023-07-07 | Disposition: A | Discharge status: Home / Self Care | Source: Ambulatory Visit | Attending: Physical Medicine and Rehabilitation | Admitting: Physical Medicine and Rehabilitation

## 2023-07-07 DIAGNOSIS — M5412 Radiculopathy, cervical region: Secondary | ICD-10-CM | POA: Diagnosis not present

## 2023-07-07 DIAGNOSIS — M4802 Spinal stenosis, cervical region: Secondary | ICD-10-CM | POA: Diagnosis not present

## 2023-07-13 DIAGNOSIS — M47816 Spondylosis without myelopathy or radiculopathy, lumbar region: Secondary | ICD-10-CM | POA: Diagnosis not present

## 2023-07-13 DIAGNOSIS — M5416 Radiculopathy, lumbar region: Secondary | ICD-10-CM | POA: Diagnosis not present

## 2023-07-13 DIAGNOSIS — G894 Chronic pain syndrome: Secondary | ICD-10-CM | POA: Diagnosis not present

## 2023-07-13 DIAGNOSIS — M47812 Spondylosis without myelopathy or radiculopathy, cervical region: Secondary | ICD-10-CM | POA: Diagnosis not present

## 2023-07-18 ENCOUNTER — Encounter: Payer: Self-pay | Admitting: Neurology

## 2023-07-18 ENCOUNTER — Ambulatory Visit: Admitting: Neurology

## 2023-07-18 VITALS — BP 131/76 | Resp 15 | Ht 65.0 in | Wt 211.5 lb

## 2023-07-18 DIAGNOSIS — M542 Cervicalgia: Secondary | ICD-10-CM | POA: Diagnosis not present

## 2023-07-18 DIAGNOSIS — E236 Other disorders of pituitary gland: Secondary | ICD-10-CM | POA: Insufficient documentation

## 2023-07-18 NOTE — Progress Notes (Signed)
 Chief Complaint  Patient presents with   New Patient (Initial Visit)    Rm14, alone,  referral for partially empty sella on MRI / Wilhelmenia Blase MD [facility] [phone] stated that she has back pain and right hand dexterity issues, bilateral feet swelling and numbness, has: 10/30 days triggers: unidentifiable. Has neck pain that radiates through bilateral shoulders but prodominantly on the left.       ASSESSMENT AND PLAN  Tiffany Martin is a 63 y.o. female   Chronic neck pain, multiple joints pain, Empty sella by MRI  Essentially normal neurological evaluation, no evidence of canal or foraminal narrowing  No evidence of papillary edema, encouraged her to follow-up with her yearly optometrist exam  Continue follow-up with pain management, return to clinic for new issues  DIAGNOSTIC DATA (LABS, IMAGING, TESTING) - I reviewed patient records, labs, notes, testing and imaging myself where available.   MEDICAL HISTORY:  Tiffany Martin, is a 63 year old female seen in request by Pain management Dr.  Verdon Cummins for evaluation of abnormal cervical MRI, Her primary Care physician is Dr. Doreene Burke, Talmadge Coventry, MD   History is obtained from the patient and review of electronic medical records. I personally reviewed pertinent available imaging films in PACS.   PMHx of  HTN Ischemic Colitis  Beta Thalassemia Celiac disease.  Since 2024, she experienced increased neck pain radiating pain to bilateral shoulder, sometimes with weak right hand grip  She is under pain management for chronic low back pain, multiple joints pain,  MRI of cervical spine July 07, 2023 showed multilevel degenerative changes, reversal of normal cervical lordosis, no significant canal foraminal narrowing  Sagittal view also showed 6 mm cerebellar tonsillar ectopia, but no evidence of compression, partial empty sella  She has been seen by ophthalmologist on a yearly basis, coming appointment in  June, denied vision trouble   PHYSICAL EXAM:   Vitals:   07/18/23 1517  BP: 131/76  Resp: 15  Weight: 211 lb 8 oz (95.9 kg)  Height: 5\' 5"  (1.651 m)   Not recorded     Body mass index is 35.2 kg/m.  PHYSICAL EXAMNIATION:  Gen: NAD, conversant, well nourised, well groomed                     Cardiovascular: Regular rate rhythm, no peripheral edema, warm, nontender. Eyes: Conjunctivae clear without exudates or hemorrhage Neck: Supple, no carotid bruits. Pulmonary: Clear to auscultation bilaterally   NEUROLOGICAL EXAM:  MENTAL STATUS: Speech/cognition: Awake, alert, oriented to history taking and casual conversation CRANIAL NERVES: CN II: Visual fields are full to confrontation. Pupils are round equal and briskly reactive to light.  Funduscopy examination showed sharp discs bilaterally CN III, IV, VI: extraocular movement are normal. No ptosis. CN V: Facial sensation is intact to light touch CN VII: Face is symmetric with normal eye closure  CN VIII: Hearing is normal to causal conversation. CN IX, X: Phonation is normal. CN XI: Head turning and shoulder shrug are intact  MOTOR: There is no pronator drift of out-stretched arms. Muscle bulk and tone are normal. Muscle strength is normal.  REFLEXES: Reflexes are 2+ and symmetric at the biceps, triceps, knees, and ankles. Plantar responses are flexor.  SENSORY: Intact to light touch, pinprick and vibratory sensation are intact in fingers and toes.  COORDINATION: There is no trunk or limb dysmetria noted.  GAIT/STANCE: Posture is normal. Gait is steady with normal steps, base, arm swing, and turning. Heel and toe  walking are normal. Tandem gait is normal.  Romberg is absent.  REVIEW OF SYSTEMS:  Full 14 system review of systems performed and notable only for as above All other review of systems were negative.   ALLERGIES: Allergies  Allergen Reactions   Methylprednisolone Anaphylaxis   Pork-Derived Products  Anaphylaxis   Codeine Nausea And Vomiting    documented per [facility] paper chart/ EMR    Morphine Nausea And Vomiting   Penicillins Hives and Rash   Aspirin    Baclofen    Gluten Meal Cough, Diarrhea, Nausea And Vomiting and Swelling   Nsaids    Latex Rash    HOME MEDICATIONS: Current Outpatient Medications  Medication Sig Dispense Refill   acetaminophen (TYLENOL) 500 MG tablet Take 1,000 mg by mouth every 8 (eight) hours as needed.     albuterol (VENTOLIN HFA) 108 (90 Base) MCG/ACT inhaler Inhale 2 puffs into the lungs every 6 (six) hours as needed for wheezing or shortness of breath. 54 g 1   amlodipine-olmesartan (AZOR) 10-20 MG tablet TAKE 1 TABLET BY MOUTH DAILY 30 tablet 0   aspirin 81 MG chewable tablet Chew 1 tablet by mouth daily.     B Complex-Biotin-FA (B-100 COMPLEX PO) Take 1 capsule by mouth daily.     buprenorphine (BUTRANS) 10 MCG/HR PTWK Place onto the skin once a week.     carvedilol (COREG) 25 MG tablet TAKE 1 TABLET(25 MG) BY MOUTH TWICE DAILY 180 tablet 2   Cholecalciferol (VITAMIN D3) 25 MCG (1000 UT) CAPS Take 1 capsule by mouth daily.     DIGESTIVE ENZYMES PO Take 1 capsule by mouth 2 (two) times daily with a meal.     diphenhydrAMINE (BENADRYL) 25 MG tablet Take 1 tablet (25 mg total) by mouth every 6 (six) hours as needed. 30 tablet 0   DULoxetine (CYMBALTA) 60 MG capsule Take 1 capsule (60 mg total) by mouth daily. 90 capsule 1   EPINEPHrine 0.3 mg/0.3 mL IJ SOAJ injection Inject into the muscle.     ferrous sulfate 325 (65 FE) MG tablet Take 1 tablet by mouth daily.     fluticasone (FLOVENT HFA) 110 MCG/ACT inhaler Inhale 2 puffs into the lungs 2 (two) times daily. With spacer 12 g 5   gabapentin (NEURONTIN) 600 MG tablet Take 1 tablet (600 mg total) by mouth 3 (three) times daily. 270 tablet 1   hydrOXYzine (VISTARIL) 25 MG capsule Take 25-50 mg by mouth at bedtime as needed.     metFORMIN (GLUCOPHAGE-XR) 500 MG 24 hr tablet TAKE 1 TABLET(500 MG) BY MOUTH AT  BEDTIME 90 tablet 1   Omeprazole 20 MG TBEC Take 1 tablet by mouth daily.     polyethylene glycol (MIRALAX) 17 g packet Take as directed once to twice daily 14 each 0   SUMAtriptan (IMITREX) 50 MG tablet Take 1 tablet (50 mg total) by mouth every 2 (two) hours as needed for migraine. May repeat once in 2 hours if headache persists or recurs. 10 tablet 2   tiZANidine (ZANAFLEX) 4 MG tablet Take 4 mg by mouth 3 (three) times daily as needed.     vitamin C (ASCORBIC ACID) 500 MG tablet Take 500 mg by mouth daily.     No current facility-administered medications for this visit.    PAST MEDICAL HISTORY: Past Medical History:  Diagnosis Date   Abdominal hernia    Beta thalassemia (HCC)    Dyspnea on exertion    GERD (gastroesophageal reflux disease)  High blood pressure    Irritable bowel syndrome    Ischemic colitis (HCC)    Lactose intolerance     PAST SURGICAL HISTORY: Past Surgical History:  Procedure Laterality Date   ABDOMINAL HYSTERECTOMY     APPENDECTOMY     CESAREAN SECTION     COLONOSCOPY     KNEE SURGERY Right    x 3   KNEE SURGERY Left    x 2   ROTATOR CUFF REPAIR Left    SHOULDER SURGERY Bilateral    for thoracic outlet syndrome   TONSILLECTOMY     UPPER GASTROINTESTINAL ENDOSCOPY      FAMILY HISTORY: Family History  Problem Relation Age of Onset   Cirrhosis Father        alcohol related   Liver disease Sister        liver failure   Breast cancer Maternal Aunt    Colon cancer Neg Hx    Esophageal cancer Neg Hx    Stomach cancer Neg Hx    Pancreatic cancer Neg Hx    Rectal cancer Neg Hx     SOCIAL HISTORY: Social History   Socioeconomic History   Marital status: Divorced    Spouse name: Not on file   Number of children: Not on file   Years of education: Not on file   Highest education level: Not on file  Occupational History   Not on file  Tobacco Use   Smoking status: Never   Smokeless tobacco: Never  Vaping Use   Vaping status: Never  Used  Substance and Sexual Activity   Alcohol use: Never   Drug use: Never   Sexual activity: Not on file  Other Topics Concern   Not on file  Social History Narrative   Not on file   Social Drivers of Health   Financial Resource Strain: Low Risk  (04/13/2018)   Received from [employer] [employer] [facility]   Overall Physicist, medical Strain (CARDIA)    Difficulty of Paying Living Expenses: Not hard at all  Food Insecurity: No Food Insecurity (05/28/2022)   Hunger Vital Sign    Worried About Running Out of Food in the Last Year: Never true    Ran Out of Food in the Last Year: Never true  Transportation Needs: No Transportation Needs (05/28/2022)   PRAPARE - Administrator, Civil Service (Medical): No    Lack of Transportation (Non-Medical): No  Physical Activity: Not on file  Stress: Stress Concern Present (04/13/2018)   Received from [employer]   [employer] of Occupational Health - Occupational Stress Questionnaire    Feeling of Stress : Very much  Social Connections: Unknown (04/13/2018)   Received from [employer] [employer] [facility]   Social Connection and Isolation Panel [NHANES]    Frequency of Communication with Friends and Family: More than three times a week    Frequency of Social Gatherings with Friends and Family: Once a week    Attends Religious Services: Not on Marketing executive or Organizations: Not on file    Attends Banker Meetings: Not on file    Marital Status: Not on file  Intimate Partner Violence: Not on file      Phebe Brasil, M.D. Ph.D.  [facility] [address] [place], [place] [address] Ph: [phone] Fax: [phone]  CC:  Raydell Cahill, MD [address], [phone]  [place],  [place] [address]  Tonna Frederic, MD

## 2023-07-20 ENCOUNTER — Other Ambulatory Visit: Payer: Self-pay | Admitting: Family Medicine

## 2023-07-20 DIAGNOSIS — I10 Essential (primary) hypertension: Secondary | ICD-10-CM

## 2023-07-28 ENCOUNTER — Ambulatory Visit: Admitting: Family Medicine

## 2023-07-28 ENCOUNTER — Encounter: Payer: Self-pay | Admitting: Family Medicine

## 2023-07-28 VITALS — BP 138/88 | HR 86 | Temp 97.6°F | Ht 65.0 in | Wt 211.4 lb

## 2023-07-28 DIAGNOSIS — E559 Vitamin D deficiency, unspecified: Secondary | ICD-10-CM

## 2023-07-28 DIAGNOSIS — D561 Beta thalassemia: Secondary | ICD-10-CM

## 2023-07-28 DIAGNOSIS — I10 Essential (primary) hypertension: Secondary | ICD-10-CM

## 2023-07-28 DIAGNOSIS — R7303 Prediabetes: Secondary | ICD-10-CM

## 2023-07-28 DIAGNOSIS — Z23 Encounter for immunization: Secondary | ICD-10-CM

## 2023-07-28 LAB — CBC
HCT: 34 % — ABNORMAL LOW (ref 36.0–46.0)
Hemoglobin: 10.8 g/dL — ABNORMAL LOW (ref 12.0–15.0)
MCHC: 31.6 g/dL (ref 30.0–36.0)
MCV: 71.3 fl — ABNORMAL LOW (ref 78.0–100.0)
Platelets: 372 10*3/uL (ref 150.0–400.0)
RBC: 4.77 Mil/uL (ref 3.87–5.11)
RDW: 17.6 % — ABNORMAL HIGH (ref 11.5–15.5)
WBC: 5.6 10*3/uL (ref 4.0–10.5)

## 2023-07-28 LAB — BASIC METABOLIC PANEL WITH GFR
BUN: 9 mg/dL (ref 6–23)
CO2: 27 meq/L (ref 19–32)
Calcium: 9.6 mg/dL (ref 8.4–10.5)
Chloride: 107 meq/L (ref 96–112)
Creatinine, Ser: 0.89 mg/dL (ref 0.40–1.20)
GFR: 68.51 mL/min (ref >60.00)
Glucose, Bld: 88 mg/dL (ref 70–99)
Potassium: 3.8 meq/L (ref 3.5–5.1)
Sodium: 143 meq/L (ref 135–145)

## 2023-07-28 LAB — VITAMIN D 25 HYDROXY (VIT D DEFICIENCY, FRACTURES): VITD: 30.33 ng/mL (ref 30.00–100.00)

## 2023-07-28 LAB — HEMOGLOBIN A1C: Hgb A1c MFr Bld: 5.4 % (ref 4.6–6.5)

## 2023-07-28 NOTE — Progress Notes (Signed)
 Established Patient Office Visit   Subjective:  Patient ID: Tiffany Martin, female    DOB: 1961-01-05  Age: 63 y.o. MRN: [id number]  Chief Complaint  Patient presents with   Medical Management of Chronic Issues    6 month follow up. Pt is fasting. Pt has a MRI that was abnormal and pt had some questions.     HPI Encounter Diagnoses  Name Primary?   Prediabetes Yes   Essential hypertension    Immunization due    Beta thalassemia (HCC)    Vitamin D  deficiency    For follow-up of above.  Has been experiencing neck pain.  Recent MRI did show disc protrusions at C4-5 and C5-6.  Empty sella syndrome noted.  She has been out of her Azor .  Did not take other blood pressure medication today.   Review of Systems  Constitutional: Negative.   HENT: Negative.    Eyes:  Negative for blurred vision, discharge and redness.  Respiratory: Negative.    Cardiovascular: Negative.   Gastrointestinal:  Negative for abdominal pain.  Genitourinary: Negative.   Musculoskeletal:  Positive for neck pain. Negative for myalgias.  Skin:  Negative for rash.  Neurological:  Negative for tingling, loss of consciousness and weakness.  Endo/Heme/Allergies:  Negative for polydipsia.     Current Outpatient Medications:    acetaminophen (TYLENOL) 500 MG tablet, Take 1,000 mg by mouth every 8 (eight) hours as needed., Disp: , Rfl:    albuterol  (VENTOLIN  HFA) 108 (90 Base) MCG/ACT inhaler, Inhale 2 puffs into the lungs every 6 (six) hours as needed for wheezing or shortness of breath., Disp: 54 g, Rfl: 1   amlodipine -olmesartan  (AZOR ) 10-20 MG tablet, TAKE 1 TABLET BY MOUTH DAILY, Disp: 30 tablet, Rfl: 0   aspirin 81 MG chewable tablet, Chew 1 tablet by mouth daily., Disp: , Rfl:    B Complex-Biotin-FA (B-100 COMPLEX PO), Take 1 capsule by mouth daily., Disp: , Rfl:    buprenorphine (BUTRANS) 10 MCG/HR PTWK, Place onto the skin once a week., Disp: , Rfl:    carvedilol  (COREG ) 25 MG tablet, TAKE 1 TABLET(25 MG)  BY MOUTH TWICE DAILY, Disp: 180 tablet, Rfl: 2   Cholecalciferol (VITAMIN D3) 25 MCG (1000 UT) CAPS, Take 1 capsule by mouth daily., Disp: , Rfl:    DIGESTIVE ENZYMES PO, Take 1 capsule by mouth 2 (two) times daily with a meal., Disp: , Rfl:    diphenhydrAMINE  (BENADRYL ) 25 MG tablet, Take 1 tablet (25 mg total) by mouth every 6 (six) hours as needed., Disp: 30 tablet, Rfl: 0   DULoxetine  (CYMBALTA ) 60 MG capsule, Take 1 capsule (60 mg total) by mouth daily., Disp: 90 capsule, Rfl: 1   EPINEPHrine  0.3 mg/0.3 mL IJ SOAJ injection, Inject into the muscle., Disp: , Rfl:    fluticasone  (FLOVENT  HFA) 110 MCG/ACT inhaler, Inhale 2 puffs into the lungs 2 (two) times daily. With spacer, Disp: 12 g, Rfl: 5   gabapentin  (NEURONTIN ) 600 MG tablet, Take 1 tablet (600 mg total) by mouth 3 (three) times daily., Disp: 270 tablet, Rfl: 1   hydrOXYzine (VISTARIL) 25 MG capsule, Take 25-50 mg by mouth at bedtime as needed., Disp: , Rfl:    metFORMIN  (GLUCOPHAGE -XR) 500 MG 24 hr tablet, TAKE 1 TABLET(500 MG) BY MOUTH AT BEDTIME, Disp: 90 tablet, Rfl: 1   polyethylene glycol (MIRALAX ) 17 g packet, Take as directed once to twice daily, Disp: 14 each, Rfl: 0   SUMAtriptan  (IMITREX ) 50 MG tablet, Take 1 tablet (50  mg total) by mouth every 2 (two) hours as needed for migraine. May repeat once in 2 hours if headache persists or recurs., Disp: 10 tablet, Rfl: 2   tiZANidine (ZANAFLEX) 4 MG tablet, Take 4 mg by mouth 3 (three) times daily as needed., Disp: , Rfl:    vitamin C (ASCORBIC ACID) 500 MG tablet, Take 500 mg by mouth daily., Disp: , Rfl:    Omeprazole 20 MG TBEC, Take 1 tablet by mouth daily. (Patient not taking: Reported on 07/28/2023), Disp: , Rfl:    Objective:     BP 138/88 (Cuff Size: Large)   Pulse 86   Temp 97.6 F (36.4 C) (Temporal)   Ht 5\' 5"  (1.651 m)   Wt 211 lb 6.4 oz (95.9 kg)   SpO2 97%   BMI 35.18 kg/m  BP Readings from Last 3 Encounters:  07/28/23 138/88  07/18/23 131/76  12/23/22  118/84   Wt Readings from Last 3 Encounters:  07/28/23 211 lb 6.4 oz (95.9 kg)  07/18/23 211 lb 8 oz (95.9 kg)  12/23/22 214 lb (97.1 kg)      Physical Exam Constitutional:      General: She is not in acute distress.    Appearance: Normal appearance. She is not ill-appearing, toxic-appearing or diaphoretic.  HENT:     Head: Normocephalic and atraumatic.     Right Ear: External ear normal.     Left Ear: External ear normal.     Mouth/Throat:     Mouth: Mucous membranes are moist.     Pharynx: Oropharynx is clear. No oropharyngeal exudate or posterior oropharyngeal erythema.  Eyes:     General: No scleral icterus.       Right eye: No discharge.        Left eye: No discharge.     Extraocular Movements: Extraocular movements intact.     Conjunctiva/sclera: Conjunctivae normal.     Pupils: Pupils are equal, round, and reactive to light.  Cardiovascular:     Rate and Rhythm: Normal rate and regular rhythm.  Pulmonary:     Effort: Pulmonary effort is normal. No respiratory distress.     Breath sounds: Normal breath sounds.  Musculoskeletal:     Cervical back: No rigidity or tenderness.  Skin:    General: Skin is warm and dry.  Neurological:     Mental Status: She is alert and oriented to person, place, and time.  Psychiatric:        Mood and Affect: Mood normal.        Behavior: Behavior normal.      No results found for any visits on 07/28/23.    The 10-year ASCVD risk score (Arnett DK, et al., 2019) is: 7.7%    Assessment & Plan:   Prediabetes -     Basic metabolic panel with GFR -     Hemoglobin A1c  Essential hypertension -     Basic metabolic panel with GFR  Immunization due -     Pneumococcal conjugate vaccine 20-valent -     Tdap vaccine greater than or equal to 7yo IM  Beta thalassemia (HCC) -     CBC  Vitamin D  deficiency -     VITAMIN D  25 Hydroxy (Vit-D Deficiency, Fractures)    Return in about 6 months (around 01/27/2024).  Will obtain a  BP cuff, and check and record blood pressures.  Take medicines daily as directed.  Adjustments made pending results of today's labs.  Prevnar 20 and Tdap  vaccines today.  Continue follow-up with pain management, neurosurgery and neurology for empty sella syndrome.  Information given on exercising to lose weight.  Tonna Frederic, MD

## 2023-08-10 ENCOUNTER — Other Ambulatory Visit: Payer: Self-pay | Admitting: Family Medicine

## 2023-08-10 DIAGNOSIS — E236 Other disorders of pituitary gland: Secondary | ICD-10-CM | POA: Diagnosis not present

## 2023-08-10 DIAGNOSIS — Z6834 Body mass index (BMI) 34.0-34.9, adult: Secondary | ICD-10-CM | POA: Diagnosis not present

## 2023-08-10 DIAGNOSIS — I10 Essential (primary) hypertension: Secondary | ICD-10-CM

## 2023-08-11 DIAGNOSIS — M5416 Radiculopathy, lumbar region: Secondary | ICD-10-CM | POA: Diagnosis not present

## 2023-08-11 DIAGNOSIS — M47816 Spondylosis without myelopathy or radiculopathy, lumbar region: Secondary | ICD-10-CM | POA: Diagnosis not present

## 2023-08-11 DIAGNOSIS — M47812 Spondylosis without myelopathy or radiculopathy, cervical region: Secondary | ICD-10-CM | POA: Diagnosis not present

## 2023-08-11 DIAGNOSIS — G894 Chronic pain syndrome: Secondary | ICD-10-CM | POA: Diagnosis not present

## 2023-08-24 ENCOUNTER — Other Ambulatory Visit: Payer: Self-pay

## 2023-08-24 ENCOUNTER — Encounter: Payer: Self-pay | Admitting: Family

## 2023-08-24 ENCOUNTER — Ambulatory Visit: Admitting: Family

## 2023-08-24 VITALS — BP 120/70 | HR 78 | Temp 98.5°F | Resp 16 | Ht 65.0 in | Wt 211.0 lb

## 2023-08-24 DIAGNOSIS — T781XXD Other adverse food reactions, not elsewhere classified, subsequent encounter: Secondary | ICD-10-CM

## 2023-08-24 DIAGNOSIS — Z888 Allergy status to other drugs, medicaments and biological substances status: Secondary | ICD-10-CM

## 2023-08-24 DIAGNOSIS — T7819XD Other adverse food reactions, not elsewhere classified, subsequent encounter: Secondary | ICD-10-CM

## 2023-08-24 DIAGNOSIS — R0609 Other forms of dyspnea: Secondary | ICD-10-CM

## 2023-08-24 DIAGNOSIS — J4520 Mild intermittent asthma, uncomplicated: Secondary | ICD-10-CM | POA: Diagnosis not present

## 2023-08-24 DIAGNOSIS — T50905D Adverse effect of unspecified drugs, medicaments and biological substances, subsequent encounter: Secondary | ICD-10-CM

## 2023-08-24 DIAGNOSIS — J302 Other seasonal allergic rhinitis: Secondary | ICD-10-CM | POA: Diagnosis not present

## 2023-08-24 MED ORDER — FLUTICASONE PROPIONATE HFA 110 MCG/ACT IN AERO: 2.0000 | INHALATION_SPRAY | Freq: Two times a day (BID) | RESPIRATORY_TRACT | 5 refills | Status: DC

## 2023-08-24 MED ORDER — EPINEPHRINE 0.3 MG/0.3ML IJ SOAJ: 0.3000 mg | INTRAMUSCULAR | 1 refills | Status: AC | PRN

## 2023-08-24 MED ORDER — ALBUTEROL SULFATE HFA 108 (90 BASE) MCG/ACT IN AERS: 2.0000 | INHALATION_SPRAY | Freq: Four times a day (QID) | RESPIRATORY_TRACT | 1 refills | Status: AC | PRN

## 2023-08-24 MED ORDER — IPRATROPIUM BROMIDE 0.06 % NA SOLN: NASAL | 2 refills | Status: AC

## 2023-08-24 NOTE — Progress Notes (Signed)
 [address] [place] [address] Dept: [phone]  FOLLOW UP NOTE  Patient ID: Tiffany Martin, female    DOB: 11/22/1960  Age: 63 y.o. MRN: [id number] Date of Office Visit: 08/24/2023  Assessment  Chief Complaint: Follow-up (Asthma states has been using pump more frequently/Allergies/), Nasal Congestion, and Breathing Problem  HPI Tiffany Martin is a 63 year old female who presents today for follow-up of reactive airway disease, allergic rhinitis with conjunctivitis, adverse effect of drug, and adverse food reaction.  She was last seen by myself on May 25, 2022 where she was not able to pass the methylprednisolone  challenge.  She reports that since her last office visit she has had all her teeth pulled.  She has temporary use on the top and she is waiting for the bottom gum area to heal.  She also mentions that she is now using a pain patch to help deal with her pain since she failed the methylprednisolone  challenge and can no longer receive injections.  Reactive airway disease: She reports shortness of breath with exertion.  She reports that she can do about 10 minutes of activity and then will have to stop and catch her breath.  She has not tried using her albuterol  before doing strenuous activity.  She denies cough, wheeze, and  tightness in chest.  When asked about nocturnal awakenings she reports that she never received her sleep machine and that she moved during COVID-19 from [place] .  She is using Flovent  110 mcg 2 puffs twice a day without any problems.  Since her last office visit she has not required any systemic steroids or made any trips to the emergency room or urgent care due to breathing problems.  She has not had to use her albuterol  inhaler in a month.  Allergic rhinitis: She reports constant rhinorrhea that is mainly clear, but for the past couple days have been green.  The rhinorrhea can be worse when she is eating.  She also reports nasal congestion sometimes at  night.  She also has postnasal drip that will cause off-and-on raspy voice for the past 1 to 1-1/2 weeks.  She is not using Ryaltris  nasal spray because it did not do anything for her.  She also mentions that insurance did not cover this nasal spray.  She has been using over-the-counter Benadryl  during the day and Xyzal at night.  Sometimes she will occasionally do Allegra .  She thinks that she has maybe had 1 sinus infection since her last office visit but did not receive an antibiotic.  Adverse food reaction: She continues to avoid pork, gluten, and dairy.  She reports dairy is more of a lactose intolerance.  Her epinephrine  autoinjector device is expired.   Drug Allergies:  Allergies  Allergen Reactions   Methylprednisolone  Anaphylaxis   Pork-Derived Products Anaphylaxis   Codeine Nausea And Vomiting    documented per [facility] paper chart/ EMR    Morphine Nausea And Vomiting   Penicillins Hives and Rash   Aspirin    Baclofen     Gluten Meal Cough, Diarrhea, Nausea And Vomiting and Swelling   Nsaids    Latex Rash    Review of Systems: Negative except as per HPI   Physical Exam: BP 120/70   Pulse 78   Temp 98.5 F (36.9 C)   Resp 16   Ht 5\' 5"  (1.651 m)   Wt 211 lb (95.7 kg)   SpO2 98%   BMI 35.11 kg/m    Physical Exam Constitutional:  Appearance: Normal appearance.  HENT:     Head: Normocephalic and atraumatic.     Comments: Pharynx normal, eyes normal, ears normal, nose: Bilateral lower turbinates mildly edematous with no drainage noted    Right Ear: Tympanic membrane, ear canal and external ear normal.     Left Ear: Tympanic membrane, ear canal and external ear normal.     Mouth/Throat:     Mouth: Mucous membranes are moist.     Pharynx: Oropharynx is clear.  Eyes:     Conjunctiva/sclera: Conjunctivae normal.  Cardiovascular:     Rate and Rhythm: Regular rhythm.     Heart sounds: Normal heart sounds.  Pulmonary:     Effort: Pulmonary effort is normal.      Breath sounds: Normal breath sounds.     Comments: Lungs clear to auscultation Musculoskeletal:     Cervical back: Neck supple.  Skin:    General: Skin is warm.  Neurological:     Mental Status: She is alert and oriented to person, place, and time.  Psychiatric:        Mood and Affect: Mood normal.        Behavior: Behavior normal.        Thought Content: Thought content normal.        Judgment: Judgment normal.     Diagnostics:  FVC 2.47 L (95%), FEV1 2.05 L (100%), FEV1/FVC 0.83.  Spirometry indicates normal spirometry.  Assessment and Plan: 1. Other seasonal allergic rhinitis   2. Mild intermittent reactive airway disease without complication   3. Adverse food reaction, subsequent encounter   4. Adverse effect of drug, subsequent encounter   5. Dyspnea on exertion     Meds ordered this encounter  Medications   fluticasone  (FLOVENT  HFA) 110 MCG/ACT inhaler    Sig: Inhale 2 puffs into the lungs 2 (two) times daily. With spacer    Dispense:  12 g    Refill:  5   EPINEPHrine  0.3 mg/0.3 mL IJ SOAJ injection    Sig: Inject 0.3 mg into the muscle as needed for anaphylaxis.    Dispense:  2 each    Refill:  1   ipratropium (ATROVENT) 0.06 % nasal spray    Sig: Place 1 to 2 sprays in each nostril once or twice a day as needed for runny nose/drainage down throat.  Caution as this can be drying    Dispense:  15 mL    Refill:  2   albuterol  (VENTOLIN  HFA) 108 (90 Base) MCG/ACT inhaler    Sig: Inhale 2 puffs into the lungs every 6 (six) hours as needed for wheezing or shortness of breath.    Dispense:  18 g    Refill:  1    Patient Instructions  Reactive airway disease Your breathing test looks good today - Have access to albuterol  inhaler 2 puffs every 4-6 hours as needed for cough/wheeze/shortness of breath/chest tightness.  May use 15-20 minutes prior to activity.   Monitor frequency of use.   - Use with spacer device - Continue Flovent   (fluticasone ) 2 puffs twice a day  with spacer device.  This is a controller inhaler meant to be used daily.  Rinse mouth after use.   Asthma control goals:  Full participation in all desired activities (may need albuterol  before activity) Albuterol  use two time or less a week on average (not counting use with activity) Cough interfering with sleep two time or less a month Oral steroids no more than once a  year No hospitalizations   Allergic rhinitis with conjunctivitis-not well controlled - Testing on 05/22/21 was positive to: grasses and lab work for environmental allergens were negative - Stop Benadryl  - Start ipratropium bromide 0.06% nasal spray using 1-2 sprays in each nostril once to twice a day as needed for runny nose/drainage in throat.Caution as this may be drying -Olopatadine  0.2% 1 drop each eye daily as needed for itchy/watery eyes - Continue Xyzal in the evening.    - Take Allegra  or Zyrtec tab in the morning - Consider nasal saline rinses 1-2 times daily to remove allergens from the nasal cavities as well as help with mucous clearance (this is especially helpful to do before the nasal sprays are given)  Adverse food reaction - Continue avoidance of pork product, gluten and dairy (avoid dairy due to lactose intolerance) - Have access to self-injectable epinephrine  (Epipen  or AuviQ) 0.3mg  at all times - Follow emergency action plan in case of allergic reaction -IgE to pork, milk and wheat are negative.skin testing on 05/22/21 to wheat, cow's milk, and pork were negative.  .  Adverse medication effect - Continue avoidance of medications you are reactive too -Consider skin testing to lidocaine.   Obstructive sleep apnea Schedule a  follow up with Dr. Gaynell Keeler to discuss getting your sleep machine  Follow-up in 4-6 weeks or sooner if needed Return in about 4 weeks (around 09/21/2023), or if symptoms worsen or fail to improve.    Thank you for the opportunity to care for this patient.  Please do not  hesitate to contact me with questions.  Tinnie Forehand, FNP [facility] 

## 2023-08-24 NOTE — Patient Instructions (Addendum)
 Reactive airway disease Your breathing test looks good today - Have access to albuterol  inhaler 2 puffs every 4-6 hours as needed for cough/wheeze/shortness of breath/chest tightness.  May use 15-20 minutes prior to activity.   Monitor frequency of use.   - Use with spacer device - Continue Flovent   (fluticasone ) 2 puffs twice a day with spacer device.  This is a controller inhaler meant to be used daily.  Rinse mouth after use.   Asthma control goals:  Full participation in all desired activities (may need albuterol  before activity) Albuterol  use two time or less a week on average (not counting use with activity) Cough interfering with sleep two time or less a month Oral steroids no more than once a year No hospitalizations   Allergic rhinitis with conjunctivitis-not well controlled - Testing on 05/22/21 was positive to: grasses and lab work for environmental allergens were negative - Stop Benadryl  - Start ipratropium bromide 0.06% nasal spray using 1-2 sprays in each nostril once to twice a day as needed for runny nose/drainage in throat.Caution as this may be drying -Olopatadine  0.2% 1 drop each eye daily as needed for itchy/watery eyes - Continue Xyzal in the evening.    - Take Allegra  or Zyrtec tab in the morning - Consider nasal saline rinses 1-2 times daily to remove allergens from the nasal cavities as well as help with mucous clearance (this is especially helpful to do before the nasal sprays are given)  Adverse food reaction - Continue avoidance of pork product, gluten and dairy (avoid dairy due to lactose intolerance) - Have access to self-injectable epinephrine  (Epipen  or AuviQ) 0.3mg  at all times - Follow emergency action plan in case of allergic reaction -IgE to pork, milk and wheat are negative.skin testing on 05/22/21 to wheat, cow's milk, and pork were negative.  .  Adverse medication effect - Continue avoidance of medications you are reactive too -Consider skin  testing to lidocaine.   Obstructive sleep apnea Schedule a  follow up with Dr. Gaynell Keeler to discuss getting your sleep machine  Follow-up in 4-6 weeks or sooner if needed

## 2023-09-08 DIAGNOSIS — M47816 Spondylosis without myelopathy or radiculopathy, lumbar region: Secondary | ICD-10-CM | POA: Diagnosis not present

## 2023-09-08 DIAGNOSIS — G894 Chronic pain syndrome: Secondary | ICD-10-CM | POA: Diagnosis not present

## 2023-09-08 DIAGNOSIS — M47812 Spondylosis without myelopathy or radiculopathy, cervical region: Secondary | ICD-10-CM | POA: Diagnosis not present

## 2023-09-08 DIAGNOSIS — M5416 Radiculopathy, lumbar region: Secondary | ICD-10-CM | POA: Diagnosis not present

## 2023-09-09 ENCOUNTER — Other Ambulatory Visit: Payer: Self-pay | Admitting: Family Medicine

## 2023-09-09 DIAGNOSIS — I10 Essential (primary) hypertension: Secondary | ICD-10-CM

## 2023-09-09 MED ORDER — AMLODIPINE-OLMESARTAN 10-20 MG PO TABS: 1.0000 | ORAL_TABLET | Freq: Every day | ORAL | 1 refills | Status: DC

## 2023-09-09 NOTE — Addendum Note (Signed)
 Addended by: Christianna Cowman A on: 09/09/2023 10:06 AM   Modules accepted: Orders

## 2023-09-20 DIAGNOSIS — M5412 Radiculopathy, cervical region: Secondary | ICD-10-CM | POA: Diagnosis not present

## 2023-09-20 DIAGNOSIS — G5621 Lesion of ulnar nerve, right upper limb: Secondary | ICD-10-CM | POA: Diagnosis not present

## 2023-09-20 DIAGNOSIS — G5601 Carpal tunnel syndrome, right upper limb: Secondary | ICD-10-CM | POA: Diagnosis not present

## 2023-09-20 NOTE — Patient Instructions (Incomplete)
 Reactive airway disease - Have access to albuterol  inhaler 2 puffs every 4-6 hours as needed for cough/wheeze/shortness of breath/chest tightness.  May use 15-20 minutes prior to activity.   Monitor frequency of use.   - Use with spacer device - Continue Flovent   (fluticasone ) 2 puffs twice a day with spacer device.  This is a controller inhaler meant to be used daily.  Rinse mouth after use.   Asthma control goals:  Full participation in all desired activities (may need albuterol  before activity) Albuterol  use two time or less a week on average (not counting use with activity) Cough interfering with sleep two time or less a month Oral steroids no more than once a year No hospitalizations   Allergic rhinitis with conjunctivitis - Testing on 05/22/21 was positive to: grasses and lab work for environmental allergens were negative - Continue ipratropium bromide  0.06% nasal spray using 1-2 sprays in each nostril once to twice a day as needed for runny nose/drainage in throat.Caution as this may be drying -Olopatadine  0.2% 1 drop each eye daily as needed for itchy/watery eyes - Continue Xyzal in the evening.    - Take Allegra  or Zyrtec tab in the morning - Consider nasal saline rinses 1-2 times daily to remove allergens from the nasal cavities as well as help with mucous clearance (this is especially helpful to do before the nasal sprays are given)  Adverse food reaction - Continue avoidance of pork product, gluten and dairy (avoid dairy due to lactose intolerance) - Have access to self-injectable epinephrine  (Epipen  or AuviQ) 0.3mg  at all times - Follow emergency action plan in case of allergic reaction -IgE to pork, milk and wheat are negative.skin testing on 05/22/21 to wheat, cow's milk, and pork were negative.  .  Adverse medication effect - Continue avoidance of medications you are reactive too -Consider skin testing to lidocaine.   Obstructive sleep apnea Schedule a  follow up with  Dr. Gaynell Keeler to discuss getting your sleep machine  Follow-up in  months or sooner if needed

## 2023-09-21 ENCOUNTER — Ambulatory Visit: Admitting: Family

## 2023-10-14 IMAGING — MG MM DIGITAL SCREENING BILAT W/ TOMO AND CAD
8 series · 8 of 24 positions shown · non-contrast
Comparison: Previous exam(s).

CLINICAL DATA: Screening.

EXAM:
DIGITAL SCREENING BILATERAL MAMMOGRAM WITH TOMOSYNTHESIS AND CAD
TECHNIQUE: Bilateral screening digital craniocaudal and mediolateral oblique
mammograms were obtained. Bilateral screening digital breast
tomosynthesis was performed. The images were evaluated with
computer-aided detection.

[R MLO synth-2D]
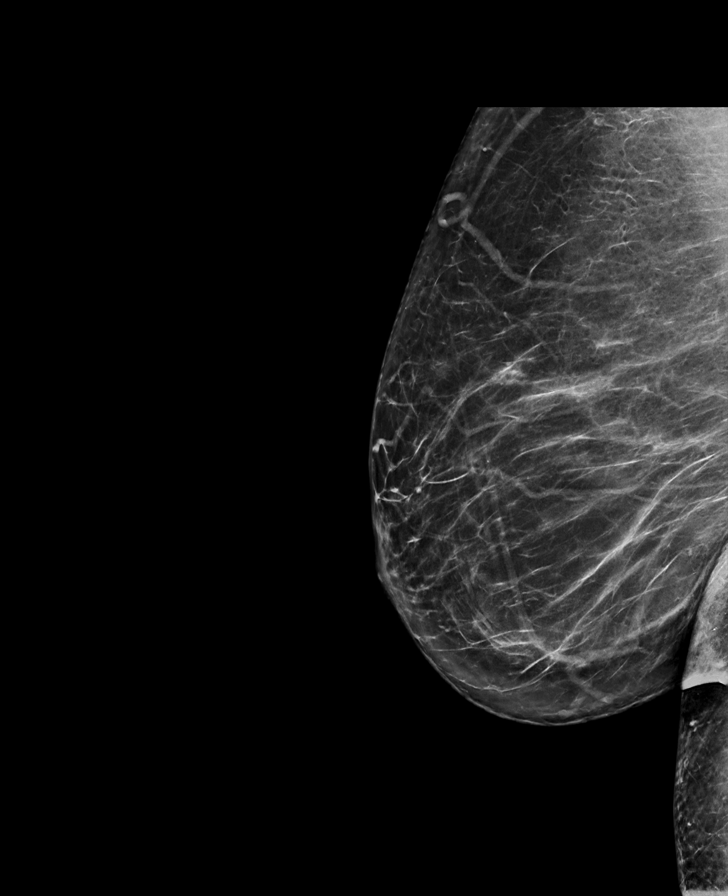

[L CC synth-2D]
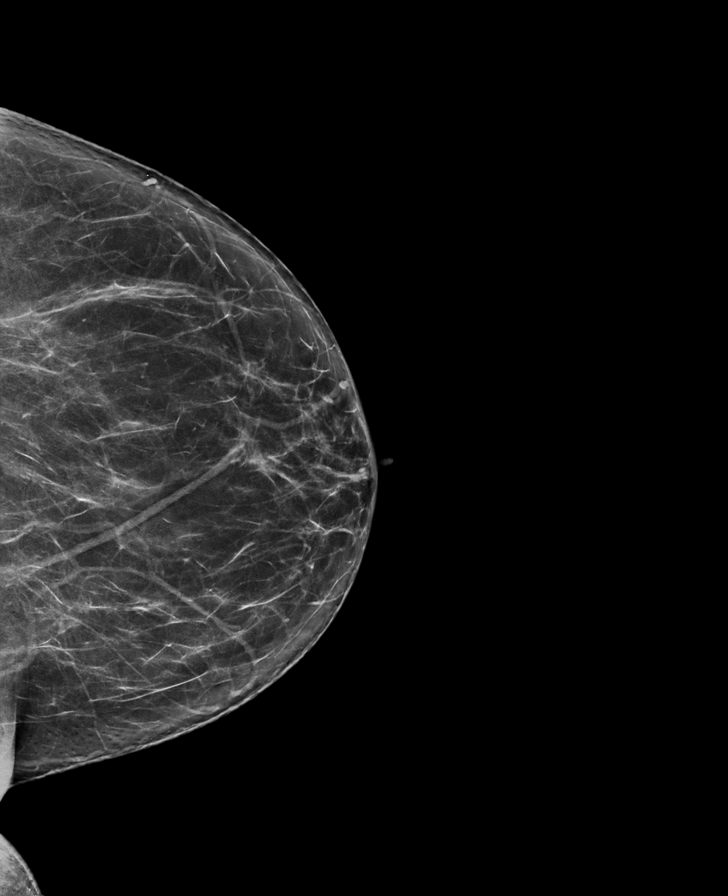

[R CC synth-2D]
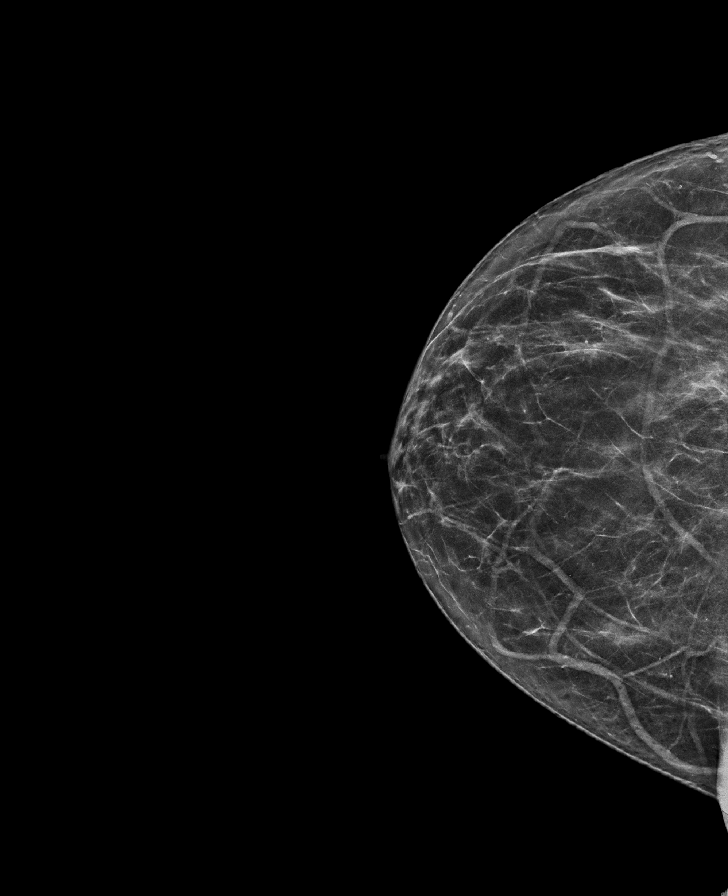

[L MLO synth-2D]
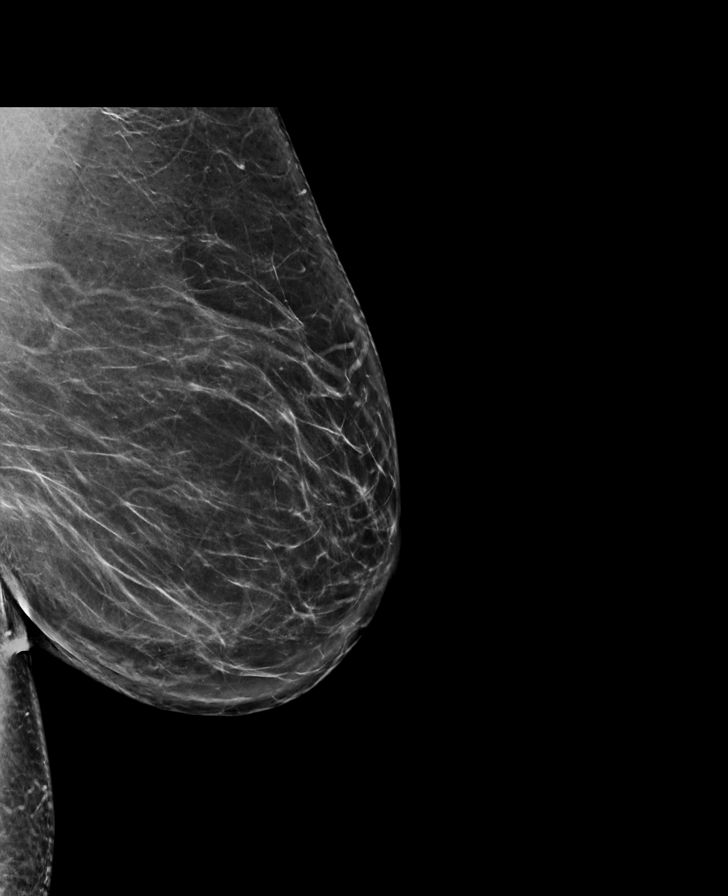

[L MLO tomo · tomo slice 44/87.0]
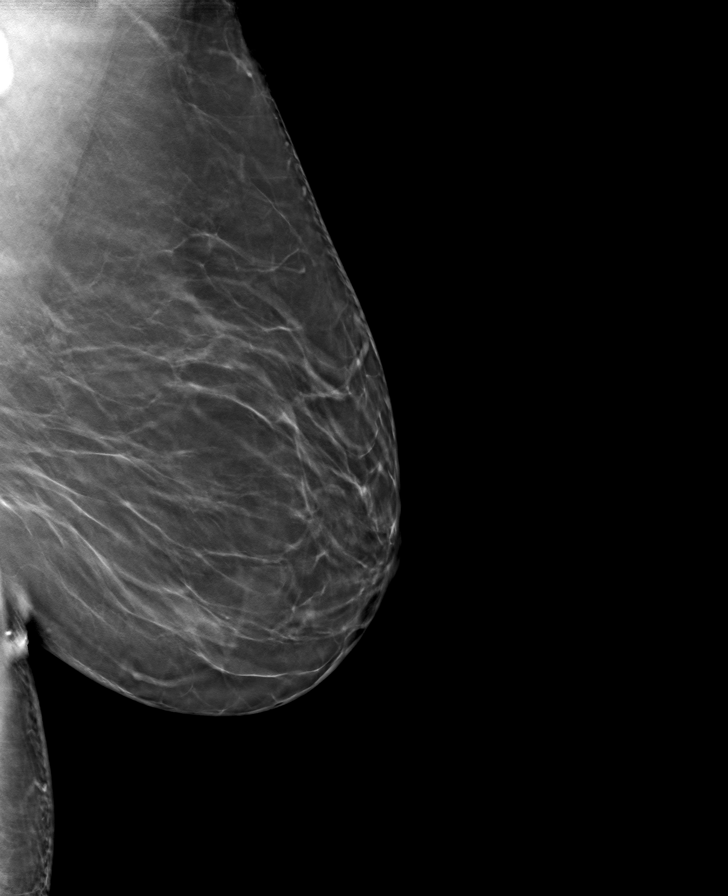

[R MLO tomo · tomo slice 43/84.0]
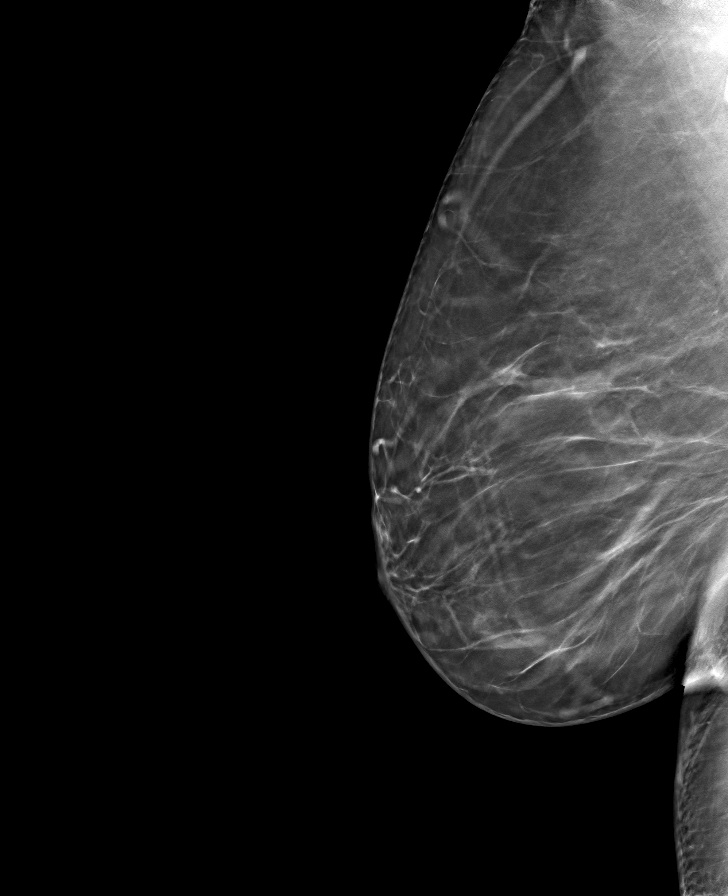

[R CC tomo · tomo slice 33/66.0]
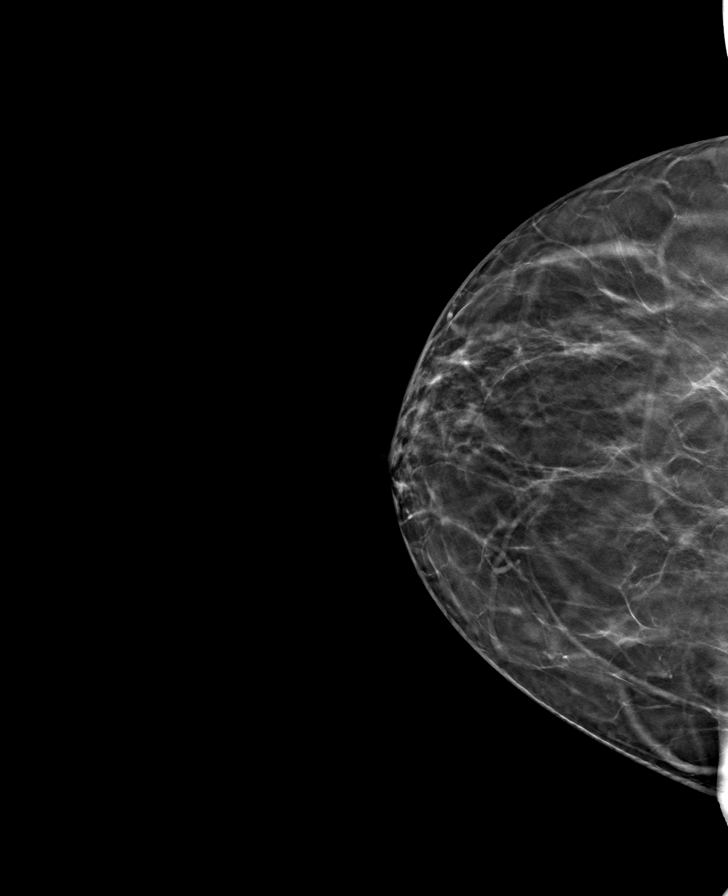

[L CC tomo · tomo slice 37/74.0]
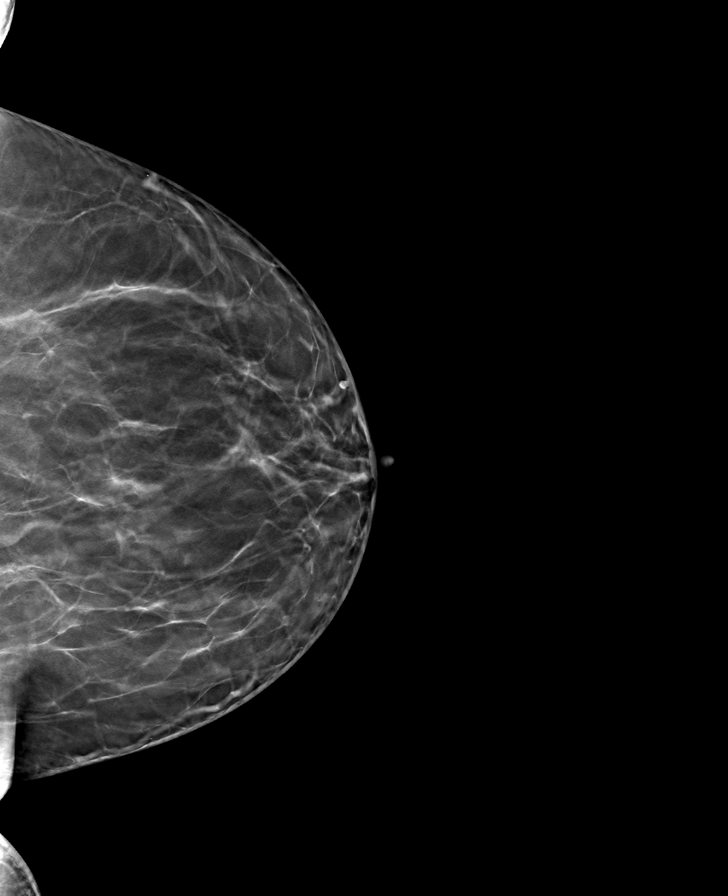

[8 of 24 positions shown; findings below may reference images not displayed]

ACR Breast Density Category b: There are scattered areas of
fibroglandular density.
FINDINGS: There are no findings suspicious for malignancy.
IMPRESSION: No mammographic evidence of malignancy. A result letter of this
screening mammogram will be mailed directly to the patient.

RECOMMENDATION:
Screening mammogram in one year. (Code:51-O-LD2)

BI-RADS CATEGORY  1: Negative.

## 2023-10-16 ENCOUNTER — Other Ambulatory Visit: Payer: Self-pay | Admitting: Family Medicine

## 2023-10-16 DIAGNOSIS — I10 Essential (primary) hypertension: Secondary | ICD-10-CM

## 2023-10-17 DIAGNOSIS — M47816 Spondylosis without myelopathy or radiculopathy, lumbar region: Secondary | ICD-10-CM | POA: Diagnosis not present

## 2023-10-17 DIAGNOSIS — G894 Chronic pain syndrome: Secondary | ICD-10-CM | POA: Diagnosis not present

## 2023-10-17 DIAGNOSIS — M47812 Spondylosis without myelopathy or radiculopathy, cervical region: Secondary | ICD-10-CM | POA: Diagnosis not present

## 2023-10-17 DIAGNOSIS — M5416 Radiculopathy, lumbar region: Secondary | ICD-10-CM | POA: Diagnosis not present

## 2023-12-08 DIAGNOSIS — M5416 Radiculopathy, lumbar region: Secondary | ICD-10-CM | POA: Diagnosis not present

## 2023-12-08 DIAGNOSIS — G894 Chronic pain syndrome: Secondary | ICD-10-CM | POA: Diagnosis not present

## 2023-12-08 DIAGNOSIS — M47816 Spondylosis without myelopathy or radiculopathy, lumbar region: Secondary | ICD-10-CM | POA: Diagnosis not present

## 2023-12-08 DIAGNOSIS — M47812 Spondylosis without myelopathy or radiculopathy, cervical region: Secondary | ICD-10-CM | POA: Diagnosis not present

## 2023-12-13 DIAGNOSIS — M5416 Radiculopathy, lumbar region: Secondary | ICD-10-CM | POA: Diagnosis not present

## 2023-12-13 DIAGNOSIS — M47816 Spondylosis without myelopathy or radiculopathy, lumbar region: Secondary | ICD-10-CM | POA: Diagnosis not present

## 2023-12-13 DIAGNOSIS — M5459 Other low back pain: Secondary | ICD-10-CM | POA: Diagnosis not present

## 2023-12-13 DIAGNOSIS — M6281 Muscle weakness (generalized): Secondary | ICD-10-CM | POA: Diagnosis not present

## 2023-12-16 DIAGNOSIS — M47816 Spondylosis without myelopathy or radiculopathy, lumbar region: Secondary | ICD-10-CM | POA: Diagnosis not present

## 2023-12-16 DIAGNOSIS — M5459 Other low back pain: Secondary | ICD-10-CM | POA: Diagnosis not present

## 2023-12-16 DIAGNOSIS — M5416 Radiculopathy, lumbar region: Secondary | ICD-10-CM | POA: Diagnosis not present

## 2023-12-16 DIAGNOSIS — M6281 Muscle weakness (generalized): Secondary | ICD-10-CM | POA: Diagnosis not present

## 2023-12-20 ENCOUNTER — Other Ambulatory Visit: Payer: Self-pay

## 2023-12-20 ENCOUNTER — Ambulatory Visit: Admitting: Family

## 2023-12-20 ENCOUNTER — Encounter: Payer: Self-pay | Admitting: Family

## 2023-12-20 VITALS — BP 100/60 | HR 68 | Temp 97.7°F

## 2023-12-20 DIAGNOSIS — J019 Acute sinusitis, unspecified: Secondary | ICD-10-CM | POA: Diagnosis not present

## 2023-12-20 DIAGNOSIS — J302 Other seasonal allergic rhinitis: Secondary | ICD-10-CM

## 2023-12-20 DIAGNOSIS — T50905D Adverse effect of unspecified drugs, medicaments and biological substances, subsequent encounter: Secondary | ICD-10-CM

## 2023-12-20 DIAGNOSIS — H1013 Acute atopic conjunctivitis, bilateral: Secondary | ICD-10-CM

## 2023-12-20 DIAGNOSIS — R0609 Other forms of dyspnea: Secondary | ICD-10-CM

## 2023-12-20 DIAGNOSIS — J4520 Mild intermittent asthma, uncomplicated: Secondary | ICD-10-CM

## 2023-12-20 DIAGNOSIS — Z888 Allergy status to other drugs, medicaments and biological substances status: Secondary | ICD-10-CM

## 2023-12-20 DIAGNOSIS — T781XXD Other adverse food reactions, not elsewhere classified, subsequent encounter: Secondary | ICD-10-CM

## 2023-12-20 MED ORDER — DOXYCYCLINE HYCLATE 100 MG PO TABS: ORAL_TABLET | ORAL | 0 refills | Status: DC

## 2023-12-20 MED ORDER — FLUTICASONE PROPIONATE HFA 110 MCG/ACT IN AERO: INHALATION_SPRAY | RESPIRATORY_TRACT | 5 refills | Status: AC

## 2023-12-20 NOTE — Patient Instructions (Addendum)
 Reactive airway disease Your breathing test looks great today - Have access to albuterol  inhaler 2 puffs every 4-6 hours as needed for cough/wheeze/shortness of breath/chest tightness.  May use 15-20 minutes prior to activity.   Monitor frequency of use.   - Use with spacer device - Continue Flovent   (fluticasone ) 2 puffs twice a day with spacer device.  This is a controller inhaler meant to be used daily.  Rinse mouth after use.   Asthma control goals:  Full participation in all desired activities (may need albuterol  before activity) Albuterol  use two time or less a week on average (not counting use with activity) Cough interfering with sleep two time or less a month Oral steroids no more than once a year No hospitalizations   Allergic rhinitis with conjunctivitis-not well controlled with acute sinus infection -Start doxycycline  100 mg taking 1 tablet twice a day for 7 days - Testing on 05/22/21 was positive to: grasses and lab work for environmental allergens were negative - Continue ipratropium bromide  0.06% nasal spray using 1-2 sprays in each nostril once to twice a day as needed for runny nose/drainage in throat.Caution as this may be drying -Olopatadine  0.2% 1 drop each eye daily as needed for itchy/watery eyes - Continue Xyzal in the evening.    - Stop Allegra  in the morning  - Start Zyrtec 10 mg taking 1  tablet in the morning as needed for runny nose - Consider nasal saline rinses 1-2 times daily to remove allergens from the nasal cavities as well as help with mucous clearance (this is especially helpful to do before the nasal sprays are given)  Adverse food reaction - Continue avoidance of pork product, gluten and dairy (avoid dairy due to lactose intolerance) - Have access to self-injectable epinephrine  (Epipen  or AuviQ) 0.3mg  at all times - Follow emergency action plan in case of allergic reaction -IgE to pork, milk and wheat are negative.skin testing on 05/22/21 to wheat,  cow's milk, and pork were negative.  .  Adverse medication effect - Continue avoidance of medications you are reactive too -Consider skin testing to lidocaine.   Obstructive sleep apnea Schedule a  follow up with Dr. Neda to discuss getting your sleep machine  Dyspnea with exertion - Recommend scheduling an appointment with your cardiologist to discuss.  Verbally instructed if she needs a referral I will gladly help  Follow-up in 4-6 months or sooner if needed

## 2023-12-20 NOTE — Progress Notes (Signed)
 [address] [place] [address] Dept: [phone]  FOLLOW UP NOTE  Patient ID: Tiffany Martin, female    DOB: [birth year]  Age: 63 y.o. MRN: [id number] Date of Office Visit: 12/20/2023  Assessment  Chief Complaint: Follow-up (Asthma/Allergies/No concerns), Headache, and Nasal Congestion  HPI Tiffany Martin is a 63 year old female who presents today for follow-up of seasonal allergic rhinitis, mild intermittent reactive airway disease without complication, adverse food reaction, adverse effect of drug, and dyspnea on exertion.  She was last seen on Aug 24, 2023 by myself.  She denies any new diagnosis or surgery since her last office visit.  Reactive airway disease: She reports that she continues to have shortness of breath with exertion that she contributes to trying to get back into shape.  She is starting water therapy.  She denies cough, wheeze, tightness in chest, and nocturnal awakenings due to breathing problems. She has not seen cardiology since 2013. Since her last office visit she has not required any systemic steroids or made any trips to the emergency room or urgent care due to breathing problems.  She reports almost 3 months ago she had a spell while in church where someone's perfume was strong and it caused her to cough.  She had to use her albuterol  2 weeks past that.  Since then she now only uses her albuterol  a couple times a week.  She reports uses fluticasone  110 mcg 2 puffs twice a day  every day with a spacer.  Allergic rhinitis with conjunctivitis: She reports for the past week to week and a half she has had rhinorrhea that is green in color and maybe a little bit clear.  She also has had nasal congestion and postnasal drip.  Also recently her left ear has been bothering her.  Her left ear has been bothering her for the past couple days.  She also reports sinus pressure on the left side.  She did a COVID test this past weekend and reports it was negative.  She did see her  dentist last week and was told that she had a slight fever of 100.2 so they did not do anything.  She mentions that for the past week and a half the ipratropium bromide  nasal spray has not seemed like it has helped.  She has been using this more consistently since getting sick.  Prior to that she was using ipratropium bromide  nasal spray as needed.  She also takes Xyzal in the evening and Allegra  in the morning.  She does not feel like Allegra  helps she has not been treated for any sinus infections since we last saw her.  Allergic conjunctivitis: She denies itchy watery eyes.  Adverse food reaction: She continues to avoid pork products, gluten and dairy.  She avoids dairy due to lactose intolerance.  She has not had to use her epinephrine  auto injector.  Adverse medication effect: She continues to avoid medications that she is reactive to.  Obstructive sleep apnea: She reports that she totally forgot to schedule an appointment with Dr. Neda about getting her sleep machine.   Drug Allergies:  Allergies  Allergen Reactions   Methylprednisolone  Anaphylaxis   Pork-Derived Products Anaphylaxis   Codeine Nausea And Vomiting    documented per [facility] paper chart/ EMR    Morphine Nausea And Vomiting   Penicillins Hives and Rash   Aspirin    Baclofen     Gluten Meal Cough, Diarrhea, Nausea And Vomiting and Swelling   Nsaids    Latex  Rash    Review of Systems: Negative except as per HPI   Physical Exam: BP 100/60   Pulse 68   Temp 97.7 F (36.5 C)   SpO2 98%    Physical Exam Constitutional:      Appearance: Normal appearance.  HENT:     Head: Normocephalic and atraumatic.     Comments: Pharynx normal, eyes normal, ears normal, nose: Bilateral lower turbinates mildly edematous with green rhinorrhea noted    Right Ear: Tympanic membrane, ear canal and external ear normal.     Left Ear: Tympanic membrane, ear canal and external ear normal.     Mouth/Throat:     Mouth: Mucous membranes  are moist.     Pharynx: Oropharynx is clear.  Eyes:     Conjunctiva/sclera: Conjunctivae normal.  Cardiovascular:     Rate and Rhythm: Regular rhythm.     Heart sounds: Normal heart sounds.  Pulmonary:     Effort: Pulmonary effort is normal.     Breath sounds: Normal breath sounds.     Comments: Lungs clear to auscultation Musculoskeletal:     Cervical back: Neck supple.  Skin:    General: Skin is warm.  Neurological:     Mental Status: She is alert and oriented to person, place, and time.  Psychiatric:        Mood and Affect: Mood normal.        Behavior: Behavior normal.        Thought Content: Thought content normal.        Judgment: Judgment normal.     Diagnostics: FVC 2.47 L (95%), FEV1 2.11 L (103%), FEV1/FVC 0.85.  Spirometry indicates normal spirometry  Assessment and Plan: 1. Acute non-recurrent sinusitis, unspecified location   2. Mild intermittent reactive airway disease without complication   3. Other seasonal allergic rhinitis   4. Adverse food reaction, subsequent encounter   5. Adverse effect of drug, subsequent encounter   6. Allergic conjunctivitis of both eyes   7. Dyspnea on exertion     Meds ordered this encounter  Medications   doxycycline  (VIBRA -TABS) 100 MG tablet    Sig: Take 1 tablet twice a day for 7 days    Dispense:  14 tablet    Refill:  0   fluticasone  (FLOVENT  HFA) 110 MCG/ACT inhaler    Sig: Inhale 2 puffs twice a day with spacer to help prevent cough and wheeze.  Rinse mouth out afterwards    Dispense:  12 g    Refill:  5    Patient Instructions  Reactive airway disease Your breathing test looks great today - Have access to albuterol  inhaler 2 puffs every 4-6 hours as needed for cough/wheeze/shortness of breath/chest tightness.  May use 15-20 minutes prior to activity.   Monitor frequency of use.   - Use with spacer device - Continue Flovent   (fluticasone ) 2 puffs twice a day with spacer device.  This is a controller inhaler  meant to be used daily.  Rinse mouth after use.   Asthma control goals:  Full participation in all desired activities (may need albuterol  before activity) Albuterol  use two time or less a week on average (not counting use with activity) Cough interfering with sleep two time or less a month Oral steroids no more than once a year No hospitalizations   Allergic rhinitis with conjunctivitis-not well controlled with acute sinus infection -Start doxycycline  100 mg taking 1 tablet twice a day for 7 days - Testing on 05/22/21 was positive  to: grasses and lab work for environmental allergens were negative - Continue ipratropium bromide  0.06% nasal spray using 1-2 sprays in each nostril once to twice a day as needed for runny nose/drainage in throat.Caution as this may be drying -Olopatadine  0.2% 1 drop each eye daily as needed for itchy/watery eyes - Continue Xyzal in the evening.    - Stop Allegra  in the morning  - Start Zyrtec 10 mg taking 1  tablet in the morning as needed for runny nose - Consider nasal saline rinses 1-2 times daily to remove allergens from the nasal cavities as well as help with mucous clearance (this is especially helpful to do before the nasal sprays are given)  Adverse food reaction - Continue avoidance of pork product, gluten and dairy (avoid dairy due to lactose intolerance) - Have access to self-injectable epinephrine  (Epipen  or AuviQ) 0.3mg  at all times - Follow emergency action plan in case of allergic reaction -IgE to pork, milk and wheat are negative.skin testing on 05/22/21 to wheat, cow's milk, and pork were negative.  .  Adverse medication effect - Continue avoidance of medications you are reactive too -Consider skin testing to lidocaine.   Obstructive sleep apnea Schedule a  follow up with Dr. Neda to discuss getting your sleep machine  Dyspnea with exertion - Recommend scheduling an appointment with your cardiologist to discuss  Follow-up in 4-6  months or sooner if needed  Return in about 6 months (around 06/18/2024), or if symptoms worsen or fail to improve.    Thank you for the opportunity to care for this patient.  Please do not hesitate to contact me with questions.  Wanda Craze, FNP [facility] 

## 2023-12-23 NOTE — Addendum Note (Signed)
 Addended by: NANCEE JON SAILOR on: 12/23/2023 04:31 PM   Modules accepted: Orders

## 2023-12-28 DIAGNOSIS — M6281 Muscle weakness (generalized): Secondary | ICD-10-CM | POA: Diagnosis not present

## 2023-12-28 DIAGNOSIS — M47816 Spondylosis without myelopathy or radiculopathy, lumbar region: Secondary | ICD-10-CM | POA: Diagnosis not present

## 2023-12-28 DIAGNOSIS — M5416 Radiculopathy, lumbar region: Secondary | ICD-10-CM | POA: Diagnosis not present

## 2023-12-28 DIAGNOSIS — M5459 Other low back pain: Secondary | ICD-10-CM | POA: Diagnosis not present

## 2023-12-30 DIAGNOSIS — M5459 Other low back pain: Secondary | ICD-10-CM | POA: Diagnosis not present

## 2023-12-30 DIAGNOSIS — M5416 Radiculopathy, lumbar region: Secondary | ICD-10-CM | POA: Diagnosis not present

## 2023-12-30 DIAGNOSIS — M47816 Spondylosis without myelopathy or radiculopathy, lumbar region: Secondary | ICD-10-CM | POA: Diagnosis not present

## 2023-12-30 DIAGNOSIS — M6281 Muscle weakness (generalized): Secondary | ICD-10-CM | POA: Diagnosis not present

## 2024-01-03 DIAGNOSIS — M6281 Muscle weakness (generalized): Secondary | ICD-10-CM | POA: Diagnosis not present

## 2024-01-03 DIAGNOSIS — M5459 Other low back pain: Secondary | ICD-10-CM | POA: Diagnosis not present

## 2024-01-03 DIAGNOSIS — M47816 Spondylosis without myelopathy or radiculopathy, lumbar region: Secondary | ICD-10-CM | POA: Diagnosis not present

## 2024-01-03 DIAGNOSIS — M5416 Radiculopathy, lumbar region: Secondary | ICD-10-CM | POA: Diagnosis not present

## 2024-01-04 DIAGNOSIS — M47816 Spondylosis without myelopathy or radiculopathy, lumbar region: Secondary | ICD-10-CM | POA: Diagnosis not present

## 2024-01-04 DIAGNOSIS — M5416 Radiculopathy, lumbar region: Secondary | ICD-10-CM | POA: Diagnosis not present

## 2024-01-04 DIAGNOSIS — M5459 Other low back pain: Secondary | ICD-10-CM | POA: Diagnosis not present

## 2024-01-04 DIAGNOSIS — M6281 Muscle weakness (generalized): Secondary | ICD-10-CM | POA: Diagnosis not present

## 2024-01-10 DIAGNOSIS — M5416 Radiculopathy, lumbar region: Secondary | ICD-10-CM | POA: Diagnosis not present

## 2024-01-10 DIAGNOSIS — M6281 Muscle weakness (generalized): Secondary | ICD-10-CM | POA: Diagnosis not present

## 2024-01-10 DIAGNOSIS — M47816 Spondylosis without myelopathy or radiculopathy, lumbar region: Secondary | ICD-10-CM | POA: Diagnosis not present

## 2024-01-10 DIAGNOSIS — M5459 Other low back pain: Secondary | ICD-10-CM | POA: Diagnosis not present

## 2024-01-11 DIAGNOSIS — M6281 Muscle weakness (generalized): Secondary | ICD-10-CM | POA: Diagnosis not present

## 2024-01-11 DIAGNOSIS — M5416 Radiculopathy, lumbar region: Secondary | ICD-10-CM | POA: Diagnosis not present

## 2024-01-11 DIAGNOSIS — M5459 Other low back pain: Secondary | ICD-10-CM | POA: Diagnosis not present

## 2024-01-11 DIAGNOSIS — M47816 Spondylosis without myelopathy or radiculopathy, lumbar region: Secondary | ICD-10-CM | POA: Diagnosis not present

## 2024-01-18 DIAGNOSIS — M5416 Radiculopathy, lumbar region: Secondary | ICD-10-CM | POA: Diagnosis not present

## 2024-01-18 DIAGNOSIS — M6281 Muscle weakness (generalized): Secondary | ICD-10-CM | POA: Diagnosis not present

## 2024-01-18 DIAGNOSIS — M47816 Spondylosis without myelopathy or radiculopathy, lumbar region: Secondary | ICD-10-CM | POA: Diagnosis not present

## 2024-01-18 DIAGNOSIS — M5459 Other low back pain: Secondary | ICD-10-CM | POA: Diagnosis not present

## 2024-01-27 ENCOUNTER — Ambulatory Visit: Admitting: Family Medicine

## 2024-01-27 ENCOUNTER — Encounter: Payer: Self-pay | Admitting: Family Medicine

## 2024-01-27 VITALS — BP 134/84 | HR 74 | Temp 97.5°F | Ht 65.0 in | Wt 211.0 lb

## 2024-01-27 DIAGNOSIS — E559 Vitamin D deficiency, unspecified: Secondary | ICD-10-CM

## 2024-01-27 DIAGNOSIS — E538 Deficiency of other specified B group vitamins: Secondary | ICD-10-CM

## 2024-01-27 DIAGNOSIS — I10 Essential (primary) hypertension: Secondary | ICD-10-CM | POA: Diagnosis not present

## 2024-01-27 DIAGNOSIS — R7303 Prediabetes: Secondary | ICD-10-CM

## 2024-01-27 DIAGNOSIS — D561 Beta thalassemia: Secondary | ICD-10-CM | POA: Diagnosis not present

## 2024-01-27 DIAGNOSIS — Z23 Encounter for immunization: Secondary | ICD-10-CM | POA: Diagnosis not present

## 2024-01-27 DIAGNOSIS — E611 Iron deficiency: Secondary | ICD-10-CM

## 2024-01-27 DIAGNOSIS — R7309 Other abnormal glucose: Secondary | ICD-10-CM

## 2024-01-27 DIAGNOSIS — K900 Celiac disease: Secondary | ICD-10-CM

## 2024-01-27 DIAGNOSIS — E739 Lactose intolerance, unspecified: Secondary | ICD-10-CM

## 2024-01-27 LAB — BASIC METABOLIC PANEL WITH GFR
BUN: 12 mg/dL (ref 6–23)
CO2: 26 meq/L (ref 19–32)
Calcium: 9.6 mg/dL (ref 8.4–10.5)
Chloride: 106 meq/L (ref 96–112)
Creatinine, Ser: 0.88 mg/dL (ref 0.40–1.20)
GFR: 69.2 mL/min (ref >60.00)
Glucose, Bld: 92 mg/dL (ref 70–99)
Potassium: 4.2 meq/L (ref 3.5–5.1)
Sodium: 143 meq/L (ref 135–145)

## 2024-01-27 LAB — B12 AND FOLATE PANEL
Folate: 11.4 ng/mL (ref >5.9)
Vitamin B-12: 286 pg/mL (ref 211–911)

## 2024-01-27 LAB — HEMOGLOBIN A1C: Hgb A1c MFr Bld: 6 % (ref 4.6–6.5)

## 2024-01-27 LAB — VITAMIN D 25 HYDROXY (VIT D DEFICIENCY, FRACTURES): VITD: 30.51 ng/mL (ref 30.00–100.00)

## 2024-01-27 MED ORDER — AMLODIPINE-OLMESARTAN 10-20 MG PO TABS: 1.0000 | ORAL_TABLET | Freq: Every day | ORAL | 3 refills | Status: AC

## 2024-01-27 MED ORDER — AMLODIPINE-OLMESARTAN 10-20 MG PO TABS: 1.0000 | ORAL_TABLET | Freq: Every day | ORAL | 1 refills | Status: DC

## 2024-01-27 MED ORDER — METFORMIN HCL ER 500 MG PO TB24: 500.0000 mg | ORAL_TABLET | Freq: Every day | ORAL | 2 refills | Status: AC

## 2024-01-27 NOTE — Progress Notes (Signed)
 Established Patient Office Visit   Subjective:  Patient ID: Tiffany Martin, female    DOB: 1960-11-27  Age: 63 y.o. MRN: [id number]  Chief Complaint  Patient presents with   Medical Management of Chronic Issues    6 moth follow up. Pt is fasting. Flu shot today.     HPI Encounter Diagnoses  Name Primary?   Prediabetes Yes   Essential hypertension    Beta thalassemia (HCC)    B12 deficiency    Iron deficiency    Immunization due    Vitamin D  deficiency    Celiac disease    Lactose intolerance    Elevated glucose    For follow-up of above.  Blood pressure normally well-controlled with Azor  and carvedilol .  She did not take her medications this morning because she is fasting.  Continues to strive for weight loss and is exercising by walking and doing some water aerobics.  Continues follow-up with pain management.  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  After her A1c fell to 5.4, she discontinued the metformin .   Review of Systems  Constitutional: Negative.   HENT: Negative.    Eyes:  Negative for blurred vision, discharge and redness.  Respiratory: Negative.    Cardiovascular: Negative.   Gastrointestinal:  Negative for abdominal pain.  Genitourinary: Negative.   Musculoskeletal: Negative.  Negative for myalgias.  Skin:  Negative for rash.  Neurological:  Negative for tingling, loss of consciousness and weakness.  Endo/Heme/Allergies:  Negative for polydipsia.     Current Outpatient Medications:    acetaminophen (TYLENOL) 500 MG tablet, Take 1,000 mg by mouth every 8 (eight) hours as needed., Disp: , Rfl:    albuterol  (VENTOLIN  HFA) 108 (90 Base) MCG/ACT inhaler, Inhale 2 puffs into the lungs every 6 (six) hours as needed for wheezing or shortness of breath., Disp: 18 g, Rfl: 1   amlodipine -olmesartan  (AZOR ) 10-20 MG tablet, Take 1 tablet by mouth daily., Disp: 90 tablet, Rfl: 3   aspirin 81 MG chewable tablet, Chew 1  tablet by mouth daily., Disp: , Rfl:    B Complex-Biotin-FA (B-100 COMPLEX PO), Take 1 capsule by mouth daily., Disp: , Rfl:    buprenorphine (BUTRANS) 10 MCG/HR PTWK, Place onto the skin once a week., Disp: , Rfl:    carvedilol  (COREG ) 25 MG tablet, TAKE 1 TABLET(25 MG) BY MOUTH TWICE DAILY, Disp: 180 tablet, Rfl: 2   Cholecalciferol (VITAMIN D3) 25 MCG (1000 UT) CAPS, Take 1 capsule by mouth daily., Disp: , Rfl:    DIGESTIVE ENZYMES PO, Take 1 capsule by mouth 2 (two) times daily with a meal., Disp: , Rfl:    diphenhydrAMINE  (BENADRYL ) 25 MG tablet, Take 1 tablet (25 mg total) by mouth every 6 (six) hours as needed., Disp: 30 tablet, Rfl: 0   doxycycline  (VIBRA -TABS) 100 MG tablet, Take 1 tablet twice a day for 7 days, Disp: 14 tablet, Rfl: 0   DULoxetine  (CYMBALTA ) 60 MG capsule, Take 1 capsule (60 mg total) by mouth daily., Disp: 90 capsule, Rfl: 1   EPINEPHrine  0.3 mg/0.3 mL IJ SOAJ injection, Inject 0.3 mg into the muscle as needed for anaphylaxis., Disp: 2 each, Rfl: 1   fluticasone  (FLOVENT  HFA) 110 MCG/ACT inhaler, Inhale 2 puffs twice a day with spacer to help prevent cough and wheeze.  Rinse mouth out afterwards, Disp: 12 g, Rfl: 5   gabapentin  (NEURONTIN ) 600 MG tablet, Take 1 tablet (600 mg total) by mouth 3 (three) times  daily., Disp: 270 tablet, Rfl: 1   hydrOXYzine (VISTARIL) 25 MG capsule, Take 25-50 mg by mouth at bedtime as needed., Disp: , Rfl:    ipratropium (ATROVENT ) 0.06 % nasal spray, Place 1 to 2 sprays in each nostril once or twice a day as needed for runny nose/drainage down throat.  Caution as this can be drying, Disp: 15 mL, Rfl: 2   metFORMIN  (GLUCOPHAGE -XR) 500 MG 24 hr tablet, Take 1 tablet (500 mg total) by mouth daily with breakfast., Disp: 90 tablet, Rfl: 2   polyethylene glycol (MIRALAX ) 17 g packet, Take as directed once to twice daily, Disp: 14 each, Rfl: 0   SUMAtriptan  (IMITREX ) 50 MG tablet, Take 1 tablet (50 mg total) by mouth every 2 (two) hours as needed  for migraine. May repeat once in 2 hours if headache persists or recurs., Disp: 10 tablet, Rfl: 2   tiZANidine (ZANAFLEX) 4 MG tablet, Take 4 mg by mouth 3 (three) times daily as needed., Disp: , Rfl:    vitamin C (ASCORBIC ACID) 500 MG tablet, Take 500 mg by mouth daily., Disp: , Rfl:    Objective:     BP 134/84 (BP Location: Right Arm, Patient Position: Sitting, Cuff Size: Large)   Pulse 74   Temp (!) 97.5 F (36.4 C) (Temporal)   Ht 5' 5 (1.651 m)   Wt 211 lb (95.7 kg)   SpO2 98%   BMI 35.11 kg/m  BP Readings from Last 3 Encounters:  01/27/24 134/84  12/20/23 100/60  08/24/23 120/70   Wt Readings from Last 3 Encounters:  01/27/24 211 lb (95.7 kg)  08/24/23 211 lb (95.7 kg)  07/28/23 211 lb 6.4 oz (95.9 kg)      Physical Exam Constitutional:      General: She is not in acute distress.    Appearance: Normal appearance. She is not ill-appearing, toxic-appearing or diaphoretic.  HENT:     Head: Normocephalic and atraumatic.     Right Ear: External ear normal.     Left Ear: External ear normal.     Mouth/Throat:     Mouth: Mucous membranes are moist.     Pharynx: Oropharynx is clear. No oropharyngeal exudate or posterior oropharyngeal erythema.  Eyes:     General: No scleral icterus.       Right eye: No discharge.        Left eye: No discharge.     Extraocular Movements: Extraocular movements intact.     Conjunctiva/sclera: Conjunctivae normal.     Pupils: Pupils are equal, round, and reactive to light.  Cardiovascular:     Rate and Rhythm: Normal rate and regular rhythm.  Pulmonary:     Effort: Pulmonary effort is normal. No respiratory distress.     Breath sounds: Normal breath sounds.  Abdominal:     General: Bowel sounds are normal.     Tenderness: There is no abdominal tenderness. There is no guarding.  Musculoskeletal:     Cervical back: No rigidity or tenderness.  Skin:    General: Skin is warm and dry.  Neurological:     Mental Status: She is alert  and oriented to person, place, and time.  Psychiatric:        Mood and Affect: Mood normal.        Behavior: Behavior normal.      No results found for any visits on 01/27/24.    The 10-year ASCVD risk score (Arnett DK, et al., 2019) is: 7.1%  Assessment & Plan:   Prediabetes -     Basic metabolic panel with GFR -     Hemoglobin A1c  Essential hypertension -     Basic metabolic panel with GFR  Beta thalassemia (HCC)  B12 deficiency -     B12 and Folate Panel  Iron deficiency -     Iron, TIBC and Ferritin Panel  Immunization due -     Flu vaccine trivalent PF, 6mos and older(Flulaval,Afluria,Fluarix,Fluzone)  Vitamin D  deficiency -     VITAMIN D  25 Hydroxy (Vit-D Deficiency, Fractures)  Celiac disease  Lactose intolerance  Elevated glucose -     metFORMIN  HCl ER; Take 1 tablet (500 mg total) by mouth daily with breakfast.  Dispense: 90 tablet; Refill: 2  Other orders -     amLODIPine -Olmesartan ; Take 1 tablet by mouth daily.  Dispense: 90 tablet; Refill: 3    Return in about 1 year (around 01/26/2025), or if symptoms worsen or fail to improve.  Doing well.  Continue follow-up with pain management.  In 1 year or sooner if needed.  Advised her to continue metformin  to help prevent onset of diabetes.  Elsie Sim Lent, MD

## 2024-01-28 LAB — IRON,TIBC AND FERRITIN PANEL
%SAT: 24 % (ref 16–45)
Ferritin: 32 ng/mL (ref 16–288)
Iron: 89 ug/dL (ref 45–160)
TIBC: 374 ug/dL (ref 250–450)

## 2024-01-30 ENCOUNTER — Ambulatory Visit: Payer: Self-pay | Admitting: Family Medicine

## 2024-02-02 DIAGNOSIS — G894 Chronic pain syndrome: Secondary | ICD-10-CM | POA: Diagnosis not present

## 2024-02-02 DIAGNOSIS — M5416 Radiculopathy, lumbar region: Secondary | ICD-10-CM | POA: Diagnosis not present

## 2024-02-02 DIAGNOSIS — M47816 Spondylosis without myelopathy or radiculopathy, lumbar region: Secondary | ICD-10-CM | POA: Diagnosis not present

## 2024-02-02 DIAGNOSIS — M47812 Spondylosis without myelopathy or radiculopathy, cervical region: Secondary | ICD-10-CM | POA: Diagnosis not present

## 2024-02-29 DIAGNOSIS — M5416 Radiculopathy, lumbar region: Secondary | ICD-10-CM | POA: Diagnosis not present

## 2024-02-29 DIAGNOSIS — M47812 Spondylosis without myelopathy or radiculopathy, cervical region: Secondary | ICD-10-CM | POA: Diagnosis not present

## 2024-02-29 DIAGNOSIS — G894 Chronic pain syndrome: Secondary | ICD-10-CM | POA: Diagnosis not present

## 2024-02-29 DIAGNOSIS — M47816 Spondylosis without myelopathy or radiculopathy, lumbar region: Secondary | ICD-10-CM | POA: Diagnosis not present

## 2024-03-10 ENCOUNTER — Encounter: Payer: Self-pay | Admitting: Family Medicine

## 2024-03-10 ENCOUNTER — Telehealth: Admitting: Nurse Practitioner

## 2024-03-10 DIAGNOSIS — J4521 Mild intermittent asthma with (acute) exacerbation: Secondary | ICD-10-CM

## 2024-03-10 MED ORDER — AZITHROMYCIN 250 MG PO TABS: ORAL_TABLET | ORAL | 0 refills | Status: AC

## 2024-03-10 NOTE — Progress Notes (Signed)
 E Visit for Asthma  Based on what you have shared with me, it looks like you may have a flare up of your asthma.  Asthma is a chronic (ongoing) lung disease which results in airway obstruction, inflammation and hyper-responsiveness.   Asthma symptoms vary from person to person, with common symptoms including nighttime awakening and decreased ability to participate in normal activities due to shortness of breath. It is often triggered by changes in weather, changes in the season, changes in air temperature, or inside (home, school, daycare or work) allergens such as animal dander, mold, mildew, woodstoves or cockroaches.   It can also be triggered by hormonal changes, extreme emotion, physical exertion or an upper respiratory tract illness.     It is important to identify the trigger and eliminate or avoid the trigger if possible.   If you have been prescribed medications to be taken on a regular basis, it is important to follow the asthma action plan and to follow guidelines to adjust medication in response to increasing symptoms of decreased peak expiratory flow rate.  Treatment: I have prescribed: Prednisone  40mg  by mouth per day for 5 - 7 days  HOME CARE Only take medications as instructed by your medical team. Consider wearing a mask or scarf to improve breathing when there is poor air quality as this has been shown to decrease irritation and decrease exacerbations Get rest. Using a humidifier may help nasal congestion and ease sore throat pain. Using a saline nasal spray works much the same way.  Cough drops, hard candies and sore throat lozenges may ease your cough.  Avoid close contacts, especially the very you and the elderly. Cover your mouth if you cough or sneeze. Always remember to wash your hands.   GET HELP RIGHT AWAY IF: You develop worsening shortness of  breath/difficulty breathing or chest tightness, breathlessness at rest, drowsy, confused or agitated, unable to speak in full sentences, or if you develop chest pain.  You have coughing fits. You develop a severe headache or visual changes. You develop shortness of breath, difficulty breathing or start having chest pain. Your symptoms persist after you have completed your treatment plan. If your symptoms do not improve within 5 days.  MAKE SURE YOU Understand these instructions. Will watch your condition. Will get help right away if you are not doing well or get worse.   Your e-visit answers were reviewed by a board certified advanced clinical practitioner to complete your personal care plan, Depending upon the condition, your plan could have included both over the counter or prescription medications.  Please review your pharmacy choice. Your safety is important to us . If you have drug allergies check your prescription carefully. You can use MyChart to ask questions about today's visit, request a non-urgent call back, or ask for a work or school excuse for 24 hours related to this e-Visit. If it has been greater than 24 hours you will need to follow up with your provider, or enter a new e-Visit to address those concerns.  You will get an e-mail in the next two days asking about your experience. I hope that your e-visit has been valuable and will speed your recovery. Thank you for using e-visits.  I have spent 5 minutes in review of e-visit questionnaire, review and updating patient chart, medical decision making and response to patient.   Rabiah Goeser W Uziel Covault, NP

## 2024-03-12 ENCOUNTER — Other Ambulatory Visit: Payer: Self-pay | Admitting: Nurse Practitioner

## 2024-03-12 DIAGNOSIS — J069 Acute upper respiratory infection, unspecified: Secondary | ICD-10-CM

## 2024-03-12 MED ORDER — BENZONATATE 200 MG PO CAPS: 200.0000 mg | ORAL_CAPSULE | Freq: Two times a day (BID) | ORAL | 0 refills | Status: AC | PRN

## 2024-03-12 NOTE — Telephone Encounter (Signed)
 I will send tessalon  however any additional URI symptoms would need to be discussed with her PCP or another virtual provider.

## 2024-04-18 ENCOUNTER — Encounter: Payer: Self-pay | Admitting: Family Medicine

## 2024-04-18 ENCOUNTER — Telehealth: Admitting: Family Medicine

## 2024-04-18 VITALS — Ht 65.0 in | Wt 212.0 lb

## 2024-04-18 DIAGNOSIS — J4541 Moderate persistent asthma with (acute) exacerbation: Secondary | ICD-10-CM | POA: Diagnosis not present

## 2024-04-18 DIAGNOSIS — I10 Essential (primary) hypertension: Secondary | ICD-10-CM

## 2024-04-18 DIAGNOSIS — R42 Dizziness and giddiness: Secondary | ICD-10-CM | POA: Diagnosis not present

## 2024-04-18 DIAGNOSIS — H9201 Otalgia, right ear: Secondary | ICD-10-CM

## 2024-04-18 MED ORDER — AZITHROMYCIN 250 MG PO TABS: ORAL_TABLET | ORAL | 0 refills | Status: AC

## 2024-04-18 NOTE — Progress Notes (Signed)
 "    Patient Care Team: Berneta Elsie Sayre, MD as PCP - General (Family Medicine) Jeffrie Oneil BROCKS, MD as PCP - Cardiology (Cardiology)  Diagnoses and Orders:   No diagnosis found. Meds ordered this encounter  Medications   azithromycin  (ZITHROMAX ) 250 MG tablet    Sig: Take 2 tablets on day 1, then 1 tablet daily on days 2 through 5    Dispense:  6 tablet    Refill:  0   Assessment & Plan:   Assessment and Plan Assessment & Plan Vertigo First vertigo episode with nausea, vomiting, tachycardia, triggered by nasal blowing. Right ear congestion noted. Stroke considered but unlikely. - Discontinued Coricidin, ensured cough medicine is decongestant-free. - Monitor blood pressure twice daily. - Seek medical attention if symptoms worsen, especially increased heart racing or dizziness. - Arranged in-office visit for tomorrow.  Acute upper respiratory infection Recent upper respiratory symptoms likely contributing to vertigo. - Prescribed Z-Pak for potential bacterial infection. - Encouraged hydration and honey or tea with honey for cough relief.  Essential hypertension Elevated blood pressure readings, possibly exacerbated by infection and decongestant use. Previous readings were normal. - Discontinued Coricidin to prevent further blood pressure elevation. - Monitor blood pressure twice daily. - Continue carvedilol  and amlodipine  as prescribed.  Asthma Managed with albuterol  and daily inhaler. - Continue current asthma management with albuterol  and daily inhaler.  Geni Shutter, DO, MS, FAAFP, Dipl. [facility] [address] [place], [address] Dept: [phone] Dept Fax: [phone]  Subjective:   History of Present Illness The patient presents with vertigo and elevated blood pressure.  Vertigo and associated otologic symptoms - New-onset vertigo since illness that began last Tuesday - Vertigo is associated with  nausea and vomiting - Symptoms are triggered mainly by blowing her nose - Right ear feels plugged, described as a tunnel sensation  Cardiovascular symptoms and hypertension - Home blood pressure readings elevated, up to 192/90 and 188/91 - Pulse around 75 - Palpitations, especially at night when lying down, with sensation of heart racing and hearing heartbeat loudly - Palpitations have increased in frequency since onset of illness - Takes carvedilol  twice daily and amlodipine  at night for blood pressure control  Visual symptoms - Blurry vision present - No double vision - No chest pain except during episodes of heart racing  Recent medication use - Used Coricidin HBP and Robitussin with honey for recent symptoms - Uses albuterol  for asthma  Review of Systems: Negative, with the exception of above mentioned in HPI.  History:   Reviewed by clinician on day of visit: allergies, medications, problem list, medical history, surgical history, family history, social history, and previous encounter notes.  Medications:   Show/hide medication list[1] Allergies[2]  Objective:   Physical Exam Eyes:     Extraocular Movements: Extraocular movements intact.     Conjunctiva/sclera: Conjunctivae normal.  Neurological:     General: No focal deficit present.     Mental Status: She is alert.  Psychiatric:        Thought Content: Thought content normal.    Attestations:   I connected with Juanna Finnicum on 04/18/2024 by a video enabled telemedicine application and verified that I am speaking with the correct person. Patient location: Home. Provider location: Office, Adult Nurse at [employer] [employer] [facility].   Geni Shutter, DO, MS, FAAFP, Dipl. [facility] at [facility] [address] [place], [address] Dept: [phone] Dept Fax: [phone]     [1]  Outpatient  Medications Prior to Visit  Medication Sig   acetaminophen (TYLENOL) 500 MG tablet Take 1,000 mg by  mouth every 8 (eight) hours as needed.   albuterol  (VENTOLIN  HFA) 108 (90 Base) MCG/ACT inhaler Inhale 2 puffs into the lungs every 6 (six) hours as needed for wheezing or shortness of breath.   amlodipine -olmesartan  (AZOR ) 10-20 MG tablet Take 1 tablet by mouth daily.   aspirin 81 MG chewable tablet Chew 1 tablet by mouth daily.   B Complex-Biotin-FA (B-100 COMPLEX PO) Take 1 capsule by mouth daily.   benzonatate  (TESSALON ) 200 MG capsule Take 1 capsule (200 mg total) by mouth 2 (two) times daily as needed for cough.   buprenorphine (BUTRANS) 10 MCG/HR PTWK Place onto the skin once a week.   carvedilol  (COREG ) 25 MG tablet TAKE 1 TABLET(25 MG) BY MOUTH TWICE DAILY   Cholecalciferol (VITAMIN D3) 25 MCG (1000 UT) CAPS Take 1 capsule by mouth daily.   DIGESTIVE ENZYMES PO Take 1 capsule by mouth 2 (two) times daily with a meal.   diphenhydrAMINE  (BENADRYL ) 25 MG tablet Take 1 tablet (25 mg total) by mouth every 6 (six) hours as needed.   DULoxetine  (CYMBALTA ) 60 MG capsule Take 1 capsule (60 mg total) by mouth daily.   EPINEPHrine  0.3 mg/0.3 mL IJ SOAJ injection Inject 0.3 mg into the muscle as needed for anaphylaxis.   fluticasone  (FLOVENT  HFA) 110 MCG/ACT inhaler Inhale 2 puffs twice a day with spacer to help prevent cough and wheeze.  Rinse mouth out afterwards   gabapentin  (NEURONTIN ) 600 MG tablet Take 1 tablet (600 mg total) by mouth 3 (three) times daily.   hydrOXYzine (VISTARIL) 25 MG capsule Take 25-50 mg by mouth at bedtime as needed.   ipratropium (ATROVENT ) 0.06 % nasal spray Place 1 to 2 sprays in each nostril once or twice a day as needed for runny nose/drainage down throat.  Caution as this can be drying   metFORMIN  (GLUCOPHAGE -XR) 500 MG 24 hr tablet Take 1 tablet (500 mg total) by mouth daily with breakfast.   polyethylene glycol (MIRALAX ) 17 g packet Take as directed once to twice daily   SUMAtriptan  (IMITREX ) 50 MG tablet Take 1 tablet (50 mg total) by mouth every 2 (two) hours as  needed for migraine. May repeat once in 2 hours if headache persists or recurs.   tiZANidine (ZANAFLEX) 4 MG tablet Take 4 mg by mouth 3 (three) times daily as needed.   vitamin C (ASCORBIC ACID) 500 MG tablet Take 500 mg by mouth daily.   [DISCONTINUED] doxycycline  (VIBRA -TABS) 100 MG tablet Take 1 tablet twice a day for 7 days   No facility-administered medications prior to visit.  [2]  Allergies Allergen Reactions   Methylprednisolone  Anaphylaxis   Porcine (Pork) Protein-Containing Drug Products Anaphylaxis   Codeine Nausea And Vomiting    documented per [facility] paper chart/ EMR    Morphine Nausea And Vomiting   Penicillins Hives and Rash   Aspirin    Baclofen     Gluten Meal Diarrhea, Nausea And Vomiting, Swelling and Cough    Celiac disease   Nsaids    Latex Rash   "

## 2024-04-19 ENCOUNTER — Ambulatory Visit: Admitting: Family Medicine

## 2024-04-19 VITALS — BP 135/73 | HR 80 | Temp 97.9°F | Ht 65.0 in | Wt 211.6 lb

## 2024-04-19 DIAGNOSIS — H66001 Acute suppurative otitis media without spontaneous rupture of ear drum, right ear: Secondary | ICD-10-CM | POA: Diagnosis not present

## 2024-04-19 DIAGNOSIS — J4541 Moderate persistent asthma with (acute) exacerbation: Secondary | ICD-10-CM | POA: Diagnosis not present

## 2024-04-19 DIAGNOSIS — I10 Essential (primary) hypertension: Secondary | ICD-10-CM | POA: Diagnosis not present

## 2024-04-19 DIAGNOSIS — J020 Streptococcal pharyngitis: Secondary | ICD-10-CM | POA: Diagnosis not present

## 2024-04-19 LAB — POCT RAPID STREP A (OFFICE): Rapid Strep A Screen: POSITIVE — AB

## 2024-04-19 MED ORDER — DEXAMETHASONE 1.5 MG (51) PO TBPK: ORAL_TABLET | ORAL | 0 refills | Status: AC

## 2024-04-19 MED ORDER — MECLIZINE HCL 12.5 MG PO TABS: 12.5000 mg | ORAL_TABLET | Freq: Three times a day (TID) | ORAL | 0 refills | Status: AC | PRN

## 2024-04-19 MED ORDER — CLINDAMYCIN HCL 300 MG PO CAPS: 300.0000 mg | ORAL_CAPSULE | Freq: Three times a day (TID) | ORAL | 0 refills | Status: AC

## 2024-04-19 MED ORDER — CEFUROXIME AXETIL 250 MG PO TABS: 250.0000 mg | ORAL_TABLET | Freq: Two times a day (BID) | ORAL | 0 refills | Status: AC

## 2024-04-19 NOTE — Patient Instructions (Addendum)
 It was very nice to see you today!  VISIT SUMMARY: During your visit, we discussed your vertigo, right ear pain, and other symptoms following a recent viral infection. We also reviewed your asthma and hypertension management.  YOUR PLAN: RIGHT OTITIS MEDIA WITH VERTIGO: You have a right ear infection with vertigo, likely due to a recent viral infection. Symptoms include right ear pain, muffled hearing, and vertigo triggered by blowing your nose. -Take ceftriaxone 250 mg twice a day for 10 days. -Take dexamethasone  1.5 mg as directed for 13 days. -Take clindamycin  300 mg three times a day for 10 days. -Take meclizine  12-25 mg every 8 hours as needed for vertigo. -Avoid decongestants due to elevated blood pressure. -Stay hydrated to help relieve symptoms.  ACUTE STREPTOCOCCAL PHARYNGITIS: You had a recent sore throat with a positive strep test. Symptoms have improved, but we are continuing treatment to address both the strep and ear infection. -Continue the current antibiotic regimen as it covers the streptococcal infection.  ASTHMA: Your asthma symptoms have worsened likely due to the recent viral infection and ear infection. You have been using your albuterol  inhaler more frequently. -Take dexamethasone  1.5 mg as directed for 13 days. -Use your albuterol  inhaler with caution due to concerns about tachycardia. -Use the nebulizer with saline to help open your sinuses.  HYPERTENSION: Your recent elevated blood pressure readings are likely due to decongestant use. Your current blood pressure is well controlled. -Monitor your blood pressure at home. -Stay hydrated to help maintain blood pressure control.  Return if symptoms worsen or fail to improve.   Take care, Arvella Hummer, MD, MS   PLEASE NOTE:  If you had any lab tests, please let us  know if you have not heard back within a few days. You may see your results on mychart before we have a chance to review them but we will give you a  call once they are reviewed by us .   If we ordered any referrals today, please let us  know if you have not heard from their office within the next week.   If you had any urgent prescriptions sent in today, please check with the pharmacy within an hour of our visit to make sure the prescription was transmitted appropriately.   Please try these tips to maintain a healthy lifestyle:  Eat at least 3 REAL meals and 1-2 snacks per day.  Aim for no more than 5 hours between eating.  If you eat breakfast, please do so within one hour of getting up.   Each meal should contain half fruits/vegetables, one quarter protein, and one quarter carbs (no bigger than a computer mouse)  Cut down on sweet beverages. This includes juice, soda, and sweet tea.   Drink at least 1 glass of water with each meal and aim for at least 8 glasses per day  Exercise at least 150 minutes every week.

## 2024-04-19 NOTE — Progress Notes (Signed)
 " Assessment & Plan   Assessment/Plan:    Problem List Items Addressed This Visit   None Visit Diagnoses       Strep throat    -  Primary   Relevant Medications   cefUROXime  (CEFTIN ) 250 MG tablet   clindamycin  (CLEOCIN ) 300 MG capsule   meclizine  (ANTIVERT ) 12.5 MG tablet   Other Relevant Orders   POCT rapid strep A (Completed)     Moderate persistent asthma with acute exacerbation       Relevant Medications   dexAMETHasone  1.5 MG (51) TBPK           Assessment and Plan Assessment & Plan Right otitis media with vertigo Right ear infection with vertigo, likely secondary to recent viral infection. Symptoms include right ear pain, muffled hearing, and vertigo exacerbated by blowing nose. Examination reveals red and puffy eardrum with significant cerumen impaction. Vertigo likely due to pressure changes affecting the inner ear equilibrium. Differential includes viral versus bacterial etiology, with bacterial infection suspected due to severity and persistence of symptoms. - Prescribed ceftriaxone 250 mg BID for 10 days. - Prescribed dexamethasone  1.5 mg course pack for 13 days. - Prescribed clindamycin  300 mg TID for 10 days. - Prescribed meclizine  12-25 mg every 8 hours as needed for vertigo. - Advised to avoid decongestants due to elevated blood pressure. - Encouraged hydration to aid in symptom relief.  Acute streptococcal pharyngitis Recent sore throat with positive strep test. Symptoms have improved, but initial presentation included severe sore throat and voice loss. Current treatment plan adjusted to address both strep and ear infection. - Continue current antibiotic regimen as it covers streptococcal infection.  Asthma Exacerbation likely due to recent viral infection and ear infection. Symptoms include wheezing and increased use of albuterol  inhaler. Concerns about tachycardia with albuterol  use. Previous steroid use with methotrexate allergy  noted, but dexamethasone   considered safe based on history. - Prescribed dexamethasone  1.5 mg course pack for 13 days. - Advised to use albuterol  inhaler with caution due to tachycardia. - Encouraged use of nebulizer with saline to open sinuses.  Hypertension Recent elevated readings likely secondary to decongestant use. Current blood pressure well controlled at 135/73 mmHg. - Advised to monitor blood pressure at home. - Encouraged hydration to maintain blood pressure control.        There are no discontinued medications.  Return if symptoms worsen or fail to improve.        Subjective:   Encounter date: 04/19/2024  Tiffany Martin is a 64 y.o. female who has Post-nasal drip; Ischemic colitis; Allergic rhinitis; Vitamin D  deficiency; History of migraine; Chronic pain syndrome; Chronic right-sided low back pain with right-sided sciatica; Screening for HIV (human immunodeficiency virus); Essential hypertension; Neck pain; Positive anti-CCP test; Lightheadedness; OA (osteoarthritis) of knee; Abnormal CT scan, colon; Constipation; Rectal bleeding; Microcytic anemia; Right knee pain; Obstructive sleep apnea syndrome; Need for influenza vaccination; Fibroma of bone; Beta thalassemia (HCC); Elevated glucose; Allergic drug reaction; Morbid obesity (HCC); Lower abdominal pain; Absence of cervix; Acute UTI; GERD (gastroesophageal reflux disease); Hemorrhoids; Left shoulder pain; Palpitations; Thoracic outlet syndrome; Venous hypertension; Precordial chest pain; Dyspnea on exertion; History of DVT (deep vein thrombosis); Dependent edema; Family history of early CAD; Elevated cholesterol; and Empty sella on their problem list..   She  has a past medical history of Abdominal hernia, Allergy  (10/17/1996), Arrhythmia (2016), Arthritis (11/28/1991), Asthma (2023), Beta thalassemia (HCC), Blood transfusion without reported diagnosis (12/05/1983), Clotting disorder (2010), COPD (chronic obstructive pulmonary disease) (HCC)  (01/18/2004), Coronary  artery disease (2012), Dyspnea on exertion, GERD (gastroesophageal reflux disease) (11/23/2006), High blood pressure, Irritable bowel syndrome, Ischemic colitis, Lactose intolerance, Myocardial infarction [facility]), Neuromuscular disorder (HCC), and Sleep apnea (02/23/2008)..   She presents with chief complaint of Dizziness (Pt presents for a f/u for the vertigo, states after she blows her nose that's when it happens. The first time it happened it scared her very bad. She also states she was experiencing right ear pain.) .   Discussed the use of AI scribe software for clinical note transcription with the patient, who gave verbal consent to proceed.  History of Present Illness Tiffany Martin is a 64 year old female who presents with vertigo symptoms. She is accompanied by her niece.  Vertigo and associated symptoms - Vertigo triggered by blowing her nose - Episodes accompanied by lightheadedness, nausea, and vomiting - Symptoms began after a recent viral infection - Initially frightening, now managed by sitting on a bed when blowing her nose  Right ear pain and congestion - Right ear pain and congestion, sensation described as 'like I'm in a tunnel' - Muffled hearing in the right ear - Oil used in the ear for symptom relief - Pain affects the whole right side of her face - Blowing her nose sometimes causes a popping sensation  Upper respiratory symptoms - Recent viral infection started with sore throat - Progressed to ear pain and congestion, predominantly on the right side  Gastrointestinal symptoms - Nausea and diarrhea - Decreased oral intake due to nausea - Attempts to maintain hydration  Asthma - Asthma managed with albuterol  as needed and daily fluid inhaler - Frequent nebulizer use recently due to breathing difficulties - Reduced albuterol  use due to concerns about tachycardia  Hypertension - Hypertension managed with amlodipine , olmesartan , and  carbatrol - Elevated blood pressure at home - Recent use of decongestants, now discontinued  Allergies - Anaphylaxis to methylprednisolone  and pork - Rash with penicillin - Currently using Flovent  inhaled steroid without adverse effects     ROS  Past Surgical History:  Procedure Laterality Date   ABDOMINAL HYSTERECTOMY  10/16/2000   APPENDECTOMY  01/12/1969   CESAREAN SECTION     COLONOSCOPY     KNEE SURGERY Right    x 3   KNEE SURGERY Left    x 2   ROTATOR CUFF REPAIR Left    SHOULDER SURGERY Bilateral    for thoracic outlet syndrome   TONSILLECTOMY     TUBAL LIGATION  12/05/1983   UPPER GASTROINTESTINAL ENDOSCOPY      Outpatient Medications Prior to Visit  Medication Sig Dispense Refill   acetaminophen (TYLENOL) 500 MG tablet Take 1,000 mg by mouth every 8 (eight) hours as needed.     albuterol  (VENTOLIN  HFA) 108 (90 Base) MCG/ACT inhaler Inhale 2 puffs into the lungs every 6 (six) hours as needed for wheezing or shortness of breath. 18 g 1   amlodipine -olmesartan  (AZOR ) 10-20 MG tablet Take 1 tablet by mouth daily. 90 tablet 3   aspirin 81 MG chewable tablet Chew 1 tablet by mouth daily.     azithromycin  (ZITHROMAX ) 250 MG tablet Take 2 tablets on day 1, then 1 tablet daily on days 2 through 5 6 tablet 0   B Complex-Biotin-FA (B-100 COMPLEX PO) Take 1 capsule by mouth daily.     benzonatate  (TESSALON ) 200 MG capsule Take 1 capsule (200 mg total) by mouth 2 (two) times daily as needed for cough. 20 capsule 0   buprenorphine (BUTRANS) 10 MCG/HR PTWK  Place onto the skin once a week.     carvedilol  (COREG ) 25 MG tablet TAKE 1 TABLET(25 MG) BY MOUTH TWICE DAILY 180 tablet 2   Cholecalciferol (VITAMIN D3) 25 MCG (1000 UT) CAPS Take 1 capsule by mouth daily.     DIGESTIVE ENZYMES PO Take 1 capsule by mouth 2 (two) times daily with a meal.     DULoxetine  (CYMBALTA ) 60 MG capsule Take 1 capsule (60 mg total) by mouth daily. 90 capsule 1   EPINEPHrine  0.3 mg/0.3 mL IJ SOAJ  injection Inject 0.3 mg into the muscle as needed for anaphylaxis. 2 each 1   fluticasone  (FLOVENT  HFA) 110 MCG/ACT inhaler Inhale 2 puffs twice a day with spacer to help prevent cough and wheeze.  Rinse mouth out afterwards 12 g 5   gabapentin  (NEURONTIN ) 600 MG tablet Take 1 tablet (600 mg total) by mouth 3 (three) times daily. 270 tablet 1   hydrOXYzine (VISTARIL) 25 MG capsule Take 25-50 mg by mouth at bedtime as needed.     ipratropium (ATROVENT ) 0.06 % nasal spray Place 1 to 2 sprays in each nostril once or twice a day as needed for runny nose/drainage down throat.  Caution as this can be drying 15 mL 2   metFORMIN  (GLUCOPHAGE -XR) 500 MG 24 hr tablet Take 1 tablet (500 mg total) by mouth daily with breakfast. 90 tablet 2   polyethylene glycol (MIRALAX ) 17 g packet Take as directed once to twice daily 14 each 0   SUMAtriptan  (IMITREX ) 50 MG tablet Take 1 tablet (50 mg total) by mouth every 2 (two) hours as needed for migraine. May repeat once in 2 hours if headache persists or recurs. 10 tablet 2   tiZANidine (ZANAFLEX) 4 MG tablet Take 4 mg by mouth 3 (three) times daily as needed.     vitamin C (ASCORBIC ACID) 500 MG tablet Take 500 mg by mouth daily.     diphenhydrAMINE  (BENADRYL ) 25 MG tablet Take 1 tablet (25 mg total) by mouth every 6 (six) hours as needed. (Patient not taking: Reported on 04/19/2024) 30 tablet 0   No facility-administered medications prior to visit.    Family History  Problem Relation Age of Onset   Anemia Mother    Arrhythmia Mother    Heart disease Mother    Hypertension Mother    Cirrhosis Father        alcohol related   Liver disease Sister        liver failure   Breast cancer Maternal Aunt    Alcohol abuse Brother    Asthma Brother    Heart disease Brother    Hypertension Brother    Diabetes Sister    Heart attack Brother    Hypertension Brother        Deceased heart attack Jun 10, 2019  Heart attack Brother        Heart attack ~ deceased 05/09/2013   Colon  cancer Neg Hx    Esophageal cancer Neg Hx    Stomach cancer Neg Hx    Pancreatic cancer Neg Hx    Rectal cancer Neg Hx     Social History   Socioeconomic History   Marital status: Divorced    Spouse name: Not on file   Number of children: Not on file   Years of education: Not on file   Highest education level: Master's degree (e.g., MA, MS, MEng, MEd, MSW, MBA)  Occupational History   Not on file  Tobacco Use  Smoking status: Never   Smokeless tobacco: Never  Vaping Use   Vaping status: Never Used  Substance and Sexual Activity   Alcohol use: Never   Drug use: Never   Sexual activity: Not on file  Other Topics Concern   Not on file  Social History Narrative   Not on file   Social Drivers of Health   Tobacco Use: Low Risk (04/18/2024)   Patient History    Smoking Tobacco Use: Never    Smokeless Tobacco Use: Never    Passive Exposure: Not on file  Financial Resource Strain: Low Risk (01/23/2024)   Overall Financial Resource Strain (CARDIA)    Difficulty of Paying Living Expenses: Not hard at all  Food Insecurity: No Food Insecurity (01/23/2024)   Epic    Worried About Programme Researcher, Broadcasting/film/video in the Last Year: Never true    Ran Out of Food in the Last Year: Never true  Transportation Needs: No Transportation Needs (01/23/2024)   Epic    Lack of Transportation (Medical): No    Lack of Transportation (Non-Medical): No  Physical Activity: Insufficiently Active (01/23/2024)   Exercise Vital Sign    Days of Exercise per Week: 2 days    Minutes of Exercise per Session: 60 min  Stress: Stress Concern Present (01/23/2024)   [employer] of Occupational Health - Occupational Stress Questionnaire    Feeling of Stress: Rather much  Social Connections: Moderately Integrated (01/23/2024)   Social Connection and Isolation Panel    Frequency of Communication with Friends and Family: More than three times a week    Frequency of Social Gatherings with Friends and Family: Twice  a week    Attends Religious Services: More than 4 times per year    Active Member of [employer] or Organizations: Yes    Attends Banker Meetings: More than 4 times per year    Marital Status: Divorced  Intimate Partner Violence: Not on file  Depression (PHQ2-9): Low Risk (04/19/2024)   Depression (PHQ2-9)    PHQ-2 Score: 0  Alcohol Screen: Not on file  Housing: Low Risk (01/23/2024)   Epic    Unable to Pay for Housing in the Last Year: No    Number of Times Moved in the Last Year: 0    Homeless in the Last Year: No  Utilities: Not on file  Health Literacy: Not on file                                                                                                  Objective:  Physical Exam: BP 135/73   Pulse 80   Temp 97.9 F (36.6 C)   Ht 5' 5 (1.651 m)   Wt 211 lb 9.6 oz (96 kg)   SpO2 98%   BMI 35.21 kg/m    Physical Exam VITALS: P- 80, BP- 135/73, SaO2- 98% GENERAL: Alert, cooperative, well developed, no acute distress. HEENT: Normocephalic, normal oropharynx, moist mucous membranes. Right eardrum red and puffy. CHEST: Clear to auscultation bilaterally. No wheezes, rhonchi, or crackles. CARDIOVASCULAR: Normal heart rate and rhythm, S1 and S2  normal without murmurs. ABDOMEN: Soft, non-tender, non-distended, without organomegaly. Normal bowel sounds. EXTREMITIES: No cyanosis or edema. NEUROLOGICAL: Cranial nerves grossly intact, moves all extremities without gross motor or sensory deficit.   Physical Exam  No results found.  Recent Results (from the past 2160 hours)  Basic metabolic panel with GFR     Status: None   Collection Time: 01/27/24 10:22 AM  Result Value Ref Range   Sodium 143 135 - 145 mEq/L   Potassium 4.2 3.5 - 5.1 mEq/L   Chloride 106 96 - 112 mEq/L   CO2 26 19 - 32 mEq/L   Glucose, Bld 92 70 - 99 mg/dL   BUN 12 6 - 23 mg/dL   Creatinine, Ser 9.11 0.40 - 1.20 mg/dL   GFR 30.79 >39.99 mL/min    Comment: Calculated using the CKD-EPI  Creatinine Equation (2021)   Calcium 9.6 8.4 - 10.5 mg/dL  A87 and Folate Panel     Status: None   Collection Time: 01/27/24 10:22 AM  Result Value Ref Range   Vitamin B-12 286 211 - 911 pg/mL   Folate 11.4 >5.9 ng/mL  Hemoglobin A1c     Status: None   Collection Time: 01/27/24 10:22 AM  Result Value Ref Range   Hgb A1c MFr Bld 6.0 4.6 - 6.5 %    Comment: Glycemic Control Guidelines for People with Diabetes:Non Diabetic:  <6%Goal of Therapy: <7%Additional Action Suggested:  >8%   Iron, TIBC and Ferritin Panel     Status: None   Collection Time: 01/27/24 10:22 AM  Result Value Ref Range   Iron 89 45 - 160 mcg/dL   TIBC 625 749 - 549 mcg/dL (calc)   %SAT 24 16 - 45 % (calc)   Ferritin 32 16 - 288 ng/mL  VITAMIN D  25 Hydroxy (Vit-D Deficiency, Fractures)     Status: None   Collection Time: 01/27/24 10:22 AM  Result Value Ref Range   VITD 30.51 30.00 - 100.00 ng/mL  POCT rapid strep A     Status: Abnormal   Collection Time: 04/19/24 11:50 AM  Result Value Ref Range   Rapid Strep A Screen Positive (A) Negative        Beverley Adine Hummer, MD, MS "

## 2024-04-20 DIAGNOSIS — J4541 Moderate persistent asthma with (acute) exacerbation: Secondary | ICD-10-CM | POA: Insufficient documentation

## 2024-04-20 DIAGNOSIS — J020 Streptococcal pharyngitis: Secondary | ICD-10-CM | POA: Insufficient documentation

## 2024-04-20 DIAGNOSIS — H66001 Acute suppurative otitis media without spontaneous rupture of ear drum, right ear: Secondary | ICD-10-CM | POA: Insufficient documentation

## 2024-06-19 ENCOUNTER — Ambulatory Visit: Admitting: Family

## 2025-01-28 ENCOUNTER — Encounter: Admitting: Family Medicine
